# Patient Record
Sex: Male | Born: 1970 | Race: Asian | Hispanic: No | Marital: Married | Smoking: Current every day smoker
Health system: Southern US, Community
[De-identification: ages and names within clinical notes are randomized; demographics above are authoritative.]

## PROBLEM LIST (undated history)

## (undated) DIAGNOSIS — K219 Gastro-esophageal reflux disease without esophagitis: Secondary | ICD-10-CM

## (undated) DIAGNOSIS — R1310 Dysphagia, unspecified: Secondary | ICD-10-CM

## (undated) DIAGNOSIS — R634 Abnormal weight loss: Secondary | ICD-10-CM

## (undated) DIAGNOSIS — J387 Other diseases of larynx: Secondary | ICD-10-CM

## (undated) DIAGNOSIS — C801 Malignant (primary) neoplasm, unspecified: Secondary | ICD-10-CM

## (undated) HISTORY — PX: NECK LESION BIOPSY: SHX2078

## (undated) HISTORY — PX: WISDOM TOOTH EXTRACTION: SHX21

---

## 2015-09-01 HISTORY — PX: OTHER SURGICAL HISTORY: SHX169

## 2015-10-10 ENCOUNTER — Emergency Department
Admission: EM | Admit: 2015-10-10 | Discharge: 2015-10-10 | Disposition: A | Discharge status: Home / Self Care | Payer: BLUE CROSS/BLUE SHIELD | Attending: Emergency Medicine | Admitting: Emergency Medicine

## 2015-10-10 ENCOUNTER — Emergency Department: Payer: BLUE CROSS/BLUE SHIELD

## 2015-10-10 ENCOUNTER — Encounter: Payer: Self-pay | Admitting: Emergency Medicine

## 2015-10-10 DIAGNOSIS — R220 Localized swelling, mass and lump, head: Secondary | ICD-10-CM

## 2015-10-10 DIAGNOSIS — F17200 Nicotine dependence, unspecified, uncomplicated: Secondary | ICD-10-CM | POA: Diagnosis not present

## 2015-10-10 DIAGNOSIS — R221 Localized swelling, mass and lump, neck: Secondary | ICD-10-CM | POA: Insufficient documentation

## 2015-10-10 LAB — CBC WITH DIFFERENTIAL/PLATELET
BAND NEUTROPHILS: 0 %
BASOS ABS: 0.1 10*3/uL (ref 0–0.1)
BLASTS: 0 %
Basophils Relative: 1 %
EOS ABS: 3.3 10*3/uL — AB (ref 0–0.7)
EOS PCT: 27 %
HCT: 39.3 % — ABNORMAL LOW (ref 40.0–52.0)
Hemoglobin: 13.2 g/dL (ref 13.0–18.0)
LYMPHS ABS: 4.1 10*3/uL — AB (ref 1.0–3.6)
Lymphocytes Relative: 33 %
MCH: 29.1 pg (ref 26.0–34.0)
MCHC: 33.5 g/dL (ref 32.0–36.0)
MCV: 86.9 fL (ref 80.0–100.0)
METAMYELOCYTES PCT: 0 %
MONOS PCT: 3 %
MYELOCYTES: 0 %
Monocytes Absolute: 0.4 10*3/uL (ref 0.2–1.0)
NEUTROS ABS: 4.5 10*3/uL (ref 1.4–6.5)
Neutrophils Relative %: 36 %
Other: 0 %
PLATELETS: 313 10*3/uL (ref 150–440)
Promyelocytes Absolute: 0 %
RBC: 4.53 MIL/uL (ref 4.40–5.90)
RDW: 11.9 % (ref 11.5–14.5)
WBC: 12.4 10*3/uL — AB (ref 3.8–10.6)
nRBC: 0 /100 WBC

## 2015-10-10 LAB — TSH: TSH: 2.042 u[IU]/mL (ref 0.350–4.500)

## 2015-10-10 LAB — BASIC METABOLIC PANEL
ANION GAP: 9 (ref 5–15)
BUN: 8 mg/dL (ref 6–20)
CALCIUM: 9.3 mg/dL (ref 8.9–10.3)
CO2: 25 mmol/L (ref 22–32)
Chloride: 104 mmol/L (ref 101–111)
Creatinine, Ser: 0.83 mg/dL (ref 0.61–1.24)
Glucose, Bld: 99 mg/dL (ref 65–99)
POTASSIUM: 4.3 mmol/L (ref 3.5–5.1)
Sodium: 138 mmol/L (ref 135–145)

## 2015-10-10 MED ORDER — IOHEXOL 300 MG/ML  SOLN
75.0000 mL | Freq: Once | INTRAMUSCULAR | Status: AC | PRN
  Administered 2015-10-10: 75 mL via INTRAVENOUS
  Filled 2015-10-10: qty 75

## 2015-10-10 NOTE — ED Notes (Signed)
Pt states has had sore throat for one month. States he was given amoxicillin at walk-in clinic. Pt reports has reduced dosage to one time a day instead of twice because it gives him diarrhea. Denies cold symptoms.

## 2015-10-10 NOTE — ED Provider Notes (Signed)
[facility] Emergency Department Provider Note  ____________________________________________  Time seen: Approximately 2:34 PM  I have reviewed the triage vital signs and the nursing notes.   HISTORY  Chief Complaint Sore Throat    HPI Bobby Randolph is a 45 y.o. male patient say his sore throat for 1 month. Patient state he went to a walk-in clinic 1 week ago and was prescribed amoxicillin to take twice a day. Patient states he decreased the dosage to one Sunday due to experiencing diarrhea taken the medication twice a day. Patient states continue having swelling in his stroke. Patient also complaining of pain to the right ear. No pelvis measures taken for this complaint. . History reviewed. No pertinent past medical history.  There are no active problems to display for this patient.   History reviewed. No pertinent past surgical history.  No current outpatient prescriptions on file.  Allergies Review of patient's allergies indicates no known allergies.  History reviewed. No pertinent family history.  Social History Social History  Substance Use Topics  . Smoking status: Current Every Day Smoker  . Smokeless tobacco: None  . Alcohol Use: No    Review of Systems Constitutional: No fever/chills Eyes: No visual changes. ENT: No sore throat. Cardiovascular: Denies chest pain. Respiratory: Denies shortness of breath. Gastrointestinal: No abdominal pain.  No nausea, no vomiting.  No diarrhea.  No constipation. Genitourinary: Negative for dysuria. Musculoskeletal: Negative for back pain. Skin: Negative for rash. Neurological: Negative for headaches, focal weakness or numbness. 10-point ROS otherwise negative.  ____________________________________________   PHYSICAL EXAM:  VITAL SIGNS: ED Triage Vitals  Enc Vitals Group     BP 10/10/15 1254 108/81 mmHg     Pulse Rate 10/10/15 1254 69     Resp 10/10/15 1254 18     Temp 10/10/15 1254  97.9 F (36.6 C)     Temp Source 10/10/15 1254 Oral     SpO2 10/10/15 1254 100 %     Weight 10/10/15 1254 126 lb (57.153 kg)     Height 10/10/15 1254 5\' 5"  (1.651 m)     Head Cir --      Peak Flow --      Pain Score 10/10/15 1255 4     Pain Loc --      Pain Edu? --      Excl. in [place]? --    Constitutional: Alert and oriented. Well appearing and in no acute distress. Eyes: Conjunctivae are normal. PERRL. EOMI. Head: Atraumatic. Nose: No congestion/rhinnorhea. Mouth/Throat: Mucous membranes are moist.  Oropharynx non-erythematous. Neck: No stridor.  No cervical spine tenderness to palpation. Hematological/Lymphatic/Immunilogical: No cervical lymphadenopathy. Bilateral  anterior cervical mass. Cardiovascular: Normal rate, regular rhythm. Grossly normal heart sounds.  Good peripheral circulation. Respiratory: Normal respiratory effort.  No retractions. Lungs CTAB. Gastrointestinal: Soft and nontender. No distention. No abdominal bruits. No CVA tenderness. Musculoskeletal: No lower extremity tenderness nor edema.  No joint effusions. Neurologic:  Normal speech and language. No gross focal neurologic deficits are appreciated. No gait instability. Skin:  Skin is warm, dry and intact. No rash noted. Psychiatric: Mood and affect are normal. Speech and behavior are normal.  ____________________________________________   LABS (all labs ordered are listed, but only abnormal results are displayed)  Labs Reviewed  CBC WITH DIFFERENTIAL/PLATELET - Abnormal; Notable for the following:    WBC 12.4 (*)    HCT 39.3 (*)    Lymphs Abs 4.1 (*)    Eosinophils Absolute 3.3 (*)    All  other components within normal limits  TSH  BASIC METABOLIC PANEL   ____________________________________________  EKG   ____________________________________________  RADIOLOGY  5.3 cm mass centered in right piriform sinus with invasion laterally through the thyrohyoid membrane with thyroid cartilage  distraction. Finding consistent with malignancy with concern for metastatic disease.  PROCEDURES  Procedure(s) performed: None  Critical Care performed: No  ____________________________________________   INITIAL IMPRESSION / ASSESSMENT AND PLAN / ED COURSE  Pertinent labs & imaging results that were available during my care of the patient were reviewed by me and considered in my medical decision making (see chart for details).  Nuchal Mass. discussed findings with on-call oncologist who will contact patient in the morning. ____________________________________________   FINAL CLINICAL IMPRESSION(S) / ED DIAGNOSES  Final diagnoses:  Head or neck swelling, mass, or lump      Sable Feil, PA-C 10/10/15 [place], PA-C 10/10/15 1857  Earleen Newport, MD 10/11/15 [phone]

## 2015-10-10 NOTE — ED Notes (Signed)
Pt to ed with c/o sore throat and right ear pain x 1 month.  Pt states he started amoxicillin about 1 week ago, but is unable to take two per day as prescribed due to diarrhea.  Pt is currently taking one tab of amoxicillin per day but states the pain remains in right ear.

## 2015-10-10 NOTE — Discharge Instructions (Signed)
Follow-up with oncologist tomorrow morning.

## 2015-10-11 ENCOUNTER — Encounter: Payer: Self-pay | Admitting: Oncology

## 2015-10-11 ENCOUNTER — Inpatient Hospital Stay: Payer: BLUE CROSS/BLUE SHIELD

## 2015-10-11 ENCOUNTER — Inpatient Hospital Stay: Payer: BLUE CROSS/BLUE SHIELD | Attending: Oncology | Admitting: Oncology

## 2015-10-11 VITALS — BP 126/86 | HR 63 | Temp 99.1°F | Resp 18 | Ht 65.0 in | Wt 133.6 lb

## 2015-10-11 DIAGNOSIS — R221 Localized swelling, mass and lump, neck: Secondary | ICD-10-CM | POA: Diagnosis not present

## 2015-10-11 DIAGNOSIS — R918 Other nonspecific abnormal finding of lung field: Secondary | ICD-10-CM | POA: Diagnosis not present

## 2015-10-11 DIAGNOSIS — R591 Generalized enlarged lymph nodes: Secondary | ICD-10-CM | POA: Insufficient documentation

## 2015-10-11 DIAGNOSIS — R634 Abnormal weight loss: Secondary | ICD-10-CM | POA: Diagnosis not present

## 2015-10-11 DIAGNOSIS — J029 Acute pharyngitis, unspecified: Secondary | ICD-10-CM | POA: Insufficient documentation

## 2015-10-11 DIAGNOSIS — F17210 Nicotine dependence, cigarettes, uncomplicated: Secondary | ICD-10-CM | POA: Insufficient documentation

## 2015-10-11 DIAGNOSIS — R1310 Dysphagia, unspecified: Secondary | ICD-10-CM | POA: Diagnosis not present

## 2015-10-11 LAB — PROTIME-INR
INR: 1.04
Prothrombin Time: 13.8 seconds (ref 11.4–15.0)

## 2015-10-11 LAB — APTT: aPTT: 36 seconds (ref 24–36)

## 2015-10-11 NOTE — Progress Notes (Signed)
2 weeks of lump on right side of neck, went to acute care got atb and developed diarrhea and then stopped the med.  Back to acute care and sent to ER and ct showed soft tissue mass and opacity in right upper lobe.  Pt having difficulty swallowing.

## 2015-10-12 ENCOUNTER — Other Ambulatory Visit: Payer: Self-pay | Admitting: Radiology

## 2015-10-12 NOTE — Progress Notes (Signed)
[place]  Telephone:([phone] [phone]  ID: Bobby Randolph OB: 1971/04/19  MR#: [id number]  [id number]  Patient Care Team: No Pcp Per Patient as PCP - General (General Practice)  CHIEF COMPLAINT:  Chief Complaint  Patient presents with  . abnormal ct scan    INTERVAL HISTORY: Patient is a 45 year old male who was in his usual state of health until he had some teeth pulled approximately one month ago. He noticed an enlarging lump on the side of his neck approximately 2 weeks ago. He was evaluated at urgent care and was given some antibiotics. He developed diarrhea from his treatments and subsequent stopped the antibiotic. The mass on his neck continued to enlarge and he subsequently went to the emergency room for further evaluation. CT scan was obtained revealing a 5 cm right neck mass. He has dysphagia. He also admits to some weight loss over the past month, but attributes this to difficulty eating and minimal PO intake. He denies any fevers or night sweats. He has no neurologic complaints. He denies any chest pain or shortness of breath. He denies any nausea, vomiting, constipation, or diarrhea. He has no urinary complaints. Patient otherwise feels well and offers no further specific complaints.  REVIEW OF SYSTEMS:   Review of Systems  Constitutional: Positive for weight loss. Negative for fever and malaise/fatigue.  HENT: Positive for sore throat. Negative for congestion, hearing loss and tinnitus.   Respiratory: Negative.  Negative for cough, hemoptysis and shortness of breath.   Cardiovascular: Negative.   Gastrointestinal: Negative.   Genitourinary: Negative.   Musculoskeletal: Negative.   Neurological: Negative.  Negative for weakness and headaches.  Endo/Heme/Allergies: Does not bruise/bleed easily.    As per HPI. Otherwise, a complete review of systems is negatve.  PAST MEDICAL HISTORY: History reviewed. No pertinent past medical  history.  PAST SURGICAL HISTORY: Past Surgical History  Procedure Laterality Date  . Tooth removal  09/01/2015    right upper wisdom tooth    FAMILY HISTORY Family History  Problem Relation Age of Onset  . Hypertension Mother   . Asthma Mother   . Hypothyroidism Sister        ADVANCED DIRECTIVES:    HEALTH MAINTENANCE: Social History  Substance Use Topics  . Smoking status: Current Every Day Smoker -- 0.25 packs/day for 20 years  . Smokeless tobacco: None  . Alcohol Use: 0.6 oz/week    1 Cans of beer per week     Comment: 1 can per week     Colonoscopy:  PAP:  Bone density:  Lipid panel:  No Known Allergies  No current outpatient prescriptions on file.   No current facility-administered medications for this visit.    OBJECTIVE: Filed Vitals:   10/11/15 1220  BP: 126/86  Pulse: 63  Temp: 99.1 F (37.3 C)  Resp: 18     Body mass index is 22.23 kg/(m^2).    ECOG FS:0 - Asymptomatic  General: Well-developed, well-nourished, no acute distress. Eyes: Pink conjunctiva, anicteric sclera. HEENT: Easily palpable right neck mass, clear oropharynx. Lungs: Clear to auscultation bilaterally. Heart: Regular rate and rhythm. No rubs, murmurs, or gallops. Abdomen: Soft, nontender, nondistended. No organomegaly noted, normoactive bowel sounds. Musculoskeletal: No edema, cyanosis, or clubbing. Neuro: Alert, answering all questions appropriately. Cranial nerves grossly intact. Skin: No rashes or petechiae noted. Psych: Normal affect.   LAB RESULTS:  Lab Results  Component Value Date   NA 138 10/10/2015   K 4.3 10/10/2015   CL 104  10/10/2015   CO2 25 10/10/2015   GLUCOSE 99 10/10/2015   BUN 8 10/10/2015   CREATININE 0.83 10/10/2015   CALCIUM 9.3 10/10/2015   GFRNONAA >60 10/10/2015   GFRAA >60 10/10/2015    Lab Results  Component Value Date   WBC 12.4* 10/10/2015   NEUTROABS 4.5 10/10/2015   HGB 13.2 10/10/2015   HCT 39.3* 10/10/2015   MCV 86.9  10/10/2015   PLT 313 10/10/2015     STUDIES: Ct Soft Tissue Neck W Contrast  10/10/2015  ADDENDUM REPORT: 10/10/2015 18:43 ADDENDUM: Partially visualized subcentimeter ground-glass opacities in the right upper lobe. Further evaluation with chest CT recommended to assess for infection/inflammation versus metastatic disease. Electronically Signed   By: Logan Bores M.D.   On: 10/10/2015 18:43  10/10/2015  CLINICAL DATA:  Right-sided sore throat and right-sided neck swelling for 1 month. Abnormal ultrasound. EXAM: CT NECK WITH CONTRAST TECHNIQUE: Multidetector CT imaging of the neck was performed using the standard protocol following the bolus administration of intravenous contrast. CONTRAST:  48mL OMNIPAQUE IOHEXOL 300 MG/ML  SOLN COMPARISON:  Thyroid ultrasound earlier today FINDINGS: Pharynx and larynx: There is a heterogeneously enhancing mass centered in the region of the right piriform sinus which measures approximately 5.3 x 3.5 x 4.7 cm. There areas of internal necrosis. The mass involves the right aryepiglottic fold, which is displaced leftward. There is mild narrowing of the supraglottic larynx. The airway remains patent. The mass extends posteriorly in the oropharynx and inferiorly into the right lateral aspect of the supraglottic larynx. The mass invades through the right thyrohyoid membrane extending laterally in the neck with involvement of the strap muscles. There are destructive changes involving the posterior aspect of the thyroid cartilage on the right. There is also asymmetric sclerosis of the right arytenoid cartilage and superior aspect of the right cricoid cartilage, indeterminate. Salivary glands: Parotid glands and left submandibular gland are unremarkable. The right submandibular gland is mildly displaced anteriorly by the above described mass. Thyroid: Unremarkable. The abut described mass is in close proximity to the superior pole of the right thyroid lobe but appears separate from  it. Lymph nodes: Mildly enlarged lymph nodes in the right neck measure up to 1.1 cm in short axis in level II, up to 8 mm in level III, up to 6 mm in level IV, and 8 mm in level IB. Vascular: Major vascular structures of the neck appear patent. Limited intracranial: The visualized portion of the brain is unremarkable. Visualized orbits: Unremarkable. Mastoids and visualized paranasal sinuses: Mild mucosal thickening in the ethmoid and maxillary sinuses. Clear mastoid air cells. Skeleton: Mild cervical spondylosis. No suspicious lytic or blastic osseous lesion identified. Upper chest: Minimal pleural thickening in the lung apices. Subcentimeter ground-glass opacities partially visualized in the right upper lobe. IMPRESSION: 1. 5.3 cm mass centered in the region of the right piriform sinus with invasion laterally through the thyrohyoid membrane and with thyroid cartilage destruction as above, consistent with malignancy. 2. Mildly enlarged right level IB-IV lymph nodes, concerning for nodal metastatic disease. Electronically Signed: By: Logan Bores M.D. On: 10/10/2015 18:16   US Soft Tissue Head/neck  10/10/2015  CLINICAL DATA:  Neck swelling for 1 month. EXAM: THYROID ULTRASOUND TECHNIQUE: Ultrasound examination of the thyroid gland and adjacent soft tissues was performed. COMPARISON:  None. FINDINGS: Right thyroid lobe Measurements: 5.1 x 1.4 x 1.7 cm.  No nodules visualized. Left thyroid lobe Measurements: 3.3 x 1.0 x 1.3 cm.  No nodules visualized. Isthmus Thickness: 0.6 cm.  Two adjacent hypoechoic nodular structures associated with the isthmus. These measure less than one cm in size, individually. These could represent isthmic nodules but difficult to exclude lymph nodes along the anterior aspect of the thyroid gland. Lymphadenopathy There is an enlarged right neck lymph node measuring 2.0 x 0.7 x 0.6 cm with loss of the normal fatty hilum. There are additional prominent lymph nodes on the right side of the  neck. Few prominent left-sided lymph nodes, largest measuring 1.7 x 0.7 x 0.5 cm. There is a large hypoechoic heterogeneous lesion along the right neck, located above the thyroid and near the right side of the jaw. This abnormal soft tissue lesion has internal vascularity and roughly measures 6.0 x 4.2 x 4.0 cm. IMPRESSION: Large heterogeneous lesion in the right upper neck, near the submandibular region. There is evidence for bilateral neck lymphadenopathy. Findings are concerning for a neoplastic process and recommend further evaluation with a post contrast neck CT. Two small nodules along the thyroid isthmus. These could represent small thyroid nodules versus adjacent small lymph nodes. Otherwise, no suspicious thyroid nodules. These results will be called to the ordering clinician or representative by the Radiologist Assistant, and communication documented in the PACS or zVision Dashboard. Electronically Signed   By: Markus Daft M.D.   On: 10/10/2015 16:15    ASSESSMENT: Right neck mass, suspicious for malignancy.  PLAN:    1. Right neck mass: Suspicious for malignancy, possibly a primary head and neck given his smoking history. Although lymphoma is also a possibility. Will get ultrasound guided biopsy later this week to determine the diagnosis. Given the groundglass opacities seen in the upper portion of patient's lung, he will also require a dedicated chest CT or possibly PET scan in the near future for further evaluation and metastatic workup. Return to clinic one week after his biopsy to discuss the results and treatment planning if necessary.  Approximately 45 minutes was spent in discussion of which greater than 50% was consultation.  Patient expressed understanding and was in agreement with this plan. He also understands that He can call clinic at any time with any questions, concerns, or complaints.     Lloyd Huger, MD   10/12/2015 1:18 PM

## 2015-10-13 ENCOUNTER — Ambulatory Visit
Admission: RE | Admit: 2015-10-13 | Discharge: 2015-10-13 | Disposition: A | Discharge status: Home / Self Care | Payer: BLUE CROSS/BLUE SHIELD | Source: Ambulatory Visit | Attending: Oncology | Admitting: Oncology

## 2015-10-13 DIAGNOSIS — F17210 Nicotine dependence, cigarettes, uncomplicated: Secondary | ICD-10-CM | POA: Insufficient documentation

## 2015-10-13 DIAGNOSIS — R1310 Dysphagia, unspecified: Secondary | ICD-10-CM | POA: Diagnosis present

## 2015-10-13 DIAGNOSIS — J029 Acute pharyngitis, unspecified: Secondary | ICD-10-CM | POA: Insufficient documentation

## 2015-10-13 DIAGNOSIS — R221 Localized swelling, mass and lump, neck: Secondary | ICD-10-CM | POA: Insufficient documentation

## 2015-10-13 MED ORDER — FENTANYL CITRATE (PF) 100 MCG/2ML IJ SOLN
INTRAMUSCULAR | Status: AC | PRN
  Administered 2015-10-13: 50 ug via INTRAVENOUS

## 2015-10-13 MED ORDER — SODIUM CHLORIDE 0.9 % IV SOLN
Freq: Once | INTRAVENOUS | Status: AC
  Administered 2015-10-13: 14:00:00 via INTRAVENOUS

## 2015-10-13 MED ORDER — MIDAZOLAM HCL 2 MG/2ML IJ SOLN
INTRAMUSCULAR | Status: AC | PRN
  Administered 2015-10-13: 1 mg via INTRAVENOUS

## 2015-10-13 NOTE — H&P (Signed)
Chief Complaint: Here for neck bx  Referring Physician(s): Bobby Randolph  History of Present Illness: Bobby Randolph is a 45 y.o. male an enlarging palpable right neck mass and abnormal adenopathy by neck CT.  Mild sore throat and dysphagia. Recent weight loss.  No fevers.  Here for [place] bx of the neck mass today.  History reviewed. No pertinent past medical history.  Past Surgical History  Procedure Laterality Date  . Tooth removal  09/01/2015    right upper wisdom tooth    Allergies: Review of patient's allergies indicates no known allergies.  Medications: Prior to Admission medications   Not on File     Family History  Problem Relation Age of Onset  . Hypertension Mother   . Asthma Mother   . Hypothyroidism Sister     Social History   Social History  . Marital Status: Married    Spouse Name: N/A  . Number of Children: N/A  . Years of Education: N/A   Social History Main Topics  . Smoking status: Current Every Day Smoker -- 0.25 packs/day for 20 years  . Smokeless tobacco: None  . Alcohol Use: 0.6 oz/week    1 Cans of beer per week     Comment: 1 can per week  . Drug Use: No  . Sexual Activity: Not Asked   Other Topics Concern  . None   Social History Narrative    ECOG Status:  1 - Symptomatic but completely ambulatory  Review of Systems: A 12 point ROS discussed and pertinent positives are indicated in the HPI above.  All other systems are negative.  Review of Systems  Vital Signs: There were no vitals taken for this visit.  Physical Exam  Constitutional: He appears well-developed and well-nourished. No distress.  Neck: Normal range of motion.  Large firm palpable right neck mass   Cardiovascular: Normal rate and regular rhythm.   No murmur heard. Pulmonary/Chest: Effort normal and breath sounds normal. No respiratory distress. He has no wheezes.  Abdominal: Soft. Bowel sounds are normal. He exhibits no distension.    Lymphadenopathy:    He has cervical adenopathy.  Skin: Skin is warm and dry. No rash noted. He is not diaphoretic. No erythema.  Psychiatric: He has a normal mood and affect. His behavior is normal.    Mallampati Score:   1  Imaging: Ct Soft Tissue Neck W Contrast  10/10/2015  ADDENDUM REPORT: 10/10/2015 18:43 ADDENDUM: Partially visualized subcentimeter ground-glass opacities in the right upper lobe. Further evaluation with chest CT recommended to assess for infection/inflammation versus metastatic disease. Electronically Signed   By: Logan Bores M.D.   On: 10/10/2015 18:43  10/10/2015  CLINICAL DATA:  Right-sided sore throat and right-sided neck swelling for 1 month. Abnormal ultrasound. EXAM: CT NECK WITH CONTRAST TECHNIQUE: Multidetector CT imaging of the neck was performed using the standard protocol following the bolus administration of intravenous contrast. CONTRAST:  18mL OMNIPAQUE IOHEXOL 300 MG/ML  SOLN COMPARISON:  Thyroid ultrasound earlier today FINDINGS: Pharynx and larynx: There is a heterogeneously enhancing mass centered in the region of the right piriform sinus which measures approximately 5.3 x 3.5 x 4.7 cm. There areas of internal necrosis. The mass involves the right aryepiglottic fold, which is displaced leftward. There is mild narrowing of the supraglottic larynx. The airway remains patent. The mass extends posteriorly in the oropharynx and inferiorly into the right lateral aspect of the supraglottic larynx. The mass invades through the right thyrohyoid membrane extending  laterally in the neck with involvement of the strap muscles. There are destructive changes involving the posterior aspect of the thyroid cartilage on the right. There is also asymmetric sclerosis of the right arytenoid cartilage and superior aspect of the right cricoid cartilage, indeterminate. Salivary glands: Parotid glands and left submandibular gland are unremarkable. The right submandibular gland is  mildly displaced anteriorly by the above described mass. Thyroid: Unremarkable. The abut described mass is in close proximity to the superior pole of the right thyroid lobe but appears separate from it. Lymph nodes: Mildly enlarged lymph nodes in the right neck measure up to 1.1 cm in short axis in level II, up to 8 mm in level III, up to 6 mm in level IV, and 8 mm in level IB. Vascular: Major vascular structures of the neck appear patent. Limited intracranial: The visualized portion of the brain is unremarkable. Visualized orbits: Unremarkable. Mastoids and visualized paranasal sinuses: Mild mucosal thickening in the ethmoid and maxillary sinuses. Clear mastoid air cells. Skeleton: Mild cervical spondylosis. No suspicious lytic or blastic osseous lesion identified. Upper chest: Minimal pleural thickening in the lung apices. Subcentimeter ground-glass opacities partially visualized in the right upper lobe. IMPRESSION: 1. 5.3 cm mass centered in the region of the right piriform sinus with invasion laterally through the thyrohyoid membrane and with thyroid cartilage destruction as above, consistent with malignancy. 2. Mildly enlarged right level IB-IV lymph nodes, concerning for nodal metastatic disease. Electronically Signed: By: Logan Bores M.D. On: 10/10/2015 18:16   US Soft Tissue Head/neck  10/10/2015  CLINICAL DATA:  Neck swelling for 1 month. EXAM: THYROID ULTRASOUND TECHNIQUE: Ultrasound examination of the thyroid gland and adjacent soft tissues was performed. COMPARISON:  None. FINDINGS: Right thyroid lobe Measurements: 5.1 x 1.4 x 1.7 cm.  No nodules visualized. Left thyroid lobe Measurements: 3.3 x 1.0 x 1.3 cm.  No nodules visualized. Isthmus Thickness: 0.6 cm. Two adjacent hypoechoic nodular structures associated with the isthmus. These measure less than one cm in size, individually. These could represent isthmic nodules but difficult to exclude lymph nodes along the anterior aspect of the thyroid  gland. Lymphadenopathy There is an enlarged right neck lymph node measuring 2.0 x 0.7 x 0.6 cm with loss of the normal fatty hilum. There are additional prominent lymph nodes on the right side of the neck. Few prominent left-sided lymph nodes, largest measuring 1.7 x 0.7 x 0.5 cm. There is a large hypoechoic heterogeneous lesion along the right neck, located above the thyroid and near the right side of the jaw. This abnormal soft tissue lesion has internal vascularity and roughly measures 6.0 x 4.2 x 4.0 cm. IMPRESSION: Large heterogeneous lesion in the right upper neck, near the submandibular region. There is evidence for bilateral neck lymphadenopathy. Findings are concerning for a neoplastic process and recommend further evaluation with a post contrast neck CT. Two small nodules along the thyroid isthmus. These could represent small thyroid nodules versus adjacent small lymph nodes. Otherwise, no suspicious thyroid nodules. These results will be called to the ordering clinician or representative by the Radiologist Assistant, and communication documented in the PACS or zVision Dashboard. Electronically Signed   By: Markus Daft M.D.   On: 10/10/2015 16:15    Labs:  CBC:  Recent Labs  10/10/15 1447  WBC 12.4*  HGB 13.2  HCT 39.3*  PLT 313    COAGS:  Recent Labs  10/11/15 1307  INR 1.04  APTT 36    BMP:  Recent Labs  10/10/15 1447  NA 138  K 4.3  CL 104  CO2 25  GLUCOSE 99  BUN 8  CALCIUM 9.3  CREATININE 0.83  GFRNONAA >60  GFRAA >60    LIVER FUNCTION TESTS: No results for input(s): BILITOT, AST, ALT, ALKPHOS, PROT, ALBUMIN in the last 8760 hours.  TUMOR MARKERS: No results for input(s): AFPTM, CEA, CA199, CHROMGRNA in the last 8760 hours.  Assessment and Plan:  Palpable large right neck mass.  Plan for [place] core bx today.  Consent obtained including risks of bleeding and airway compromise.  Thank you for this interesting consult.  I greatly enjoyed meeting Bobby  Randolph and look forward to participating in their care.  A copy of this report was sent to the requesting provider on this date.  Electronically Signed: Greggory Keen 10/13/2015, 2:22 PM   I spent a total of  15 Minutes   in face to face in clinical consultation, greater than 50% of which was counseling/coordinating care for palpable right neck massconcerning for malignancy.

## 2015-10-13 NOTE — Procedures (Signed)
S/p [place] bx of the right neck mass No comp Stable Path pending Full report in PACS

## 2015-10-13 NOTE — Discharge Instructions (Signed)
Bone Marrow Aspiration and Bone Marrow Biopsy, Care After °Refer to this sheet in the next few weeks. These instructions provide you with information about caring for yourself after your procedure. Your health care provider may also give you more specific instructions. Your treatment has been planned according to current medical practices, but problems sometimes occur. Call your health care provider if you have any problems or questions after your procedure. °WHAT TO EXPECT AFTER THE PROCEDURE °After your procedure, it is common to have: °· Soreness or tenderness around the puncture site. °· Bruising. °HOME CARE INSTRUCTIONS °· Take medicines only as directed by your health care provider. °· Follow your health care provider's instructions about: °¨ Puncture site care. °¨ Bandage (dressing) changes and removal. °· Bathe and shower as directed by your health care provider. °· Check your puncture site every day for signs of infection. Watch for: °¨ Redness, swelling, or pain. °¨ Fluid, blood, or pus. °· Return to your normal activities as directed by your health care provider. °· Keep all follow-up visits as directed by your health care provider. This is important. °SEEK MEDICAL CARE IF: °· You have a fever. °· You have uncontrollable bleeding. °· You have redness, swelling, or pain at the site of your puncture. °· You have fluid, blood, or pus coming from your puncture site. °  °This information is not intended to replace advice given to you by your health care provider. Make sure you discuss any questions you have with your health care provider. °  °Document Released: 03/23/2005 Document Revised: 01/18/2015 Document Reviewed: 08/25/2014 °Elsevier Interactive Patient Education ©2016 [employer] ° °

## 2015-10-14 ENCOUNTER — Other Ambulatory Visit: Payer: BLUE CROSS/BLUE SHIELD

## 2015-10-14 ENCOUNTER — Other Ambulatory Visit: Payer: Self-pay | Admitting: Otolaryngology

## 2015-10-14 ENCOUNTER — Encounter: Payer: Self-pay | Admitting: *Deleted

## 2015-10-14 DIAGNOSIS — J387 Other diseases of larynx: Secondary | ICD-10-CM

## 2015-10-14 NOTE — Patient Instructions (Signed)
  Your procedure is scheduled on: 10-17-15 Report to [facility] To find out your arrival time please call [phone] between 1PM - 3PM on 10-14-15  Remember: Instructions that are not followed completely may result in serious medical risk, up to and including death, or upon the discretion of your surgeon and anesthesiologist your surgery may need to be rescheduled.    _X___ 1. Do not eat food or drink liquids after midnight. No gum chewing or hard candies.     _X___ 2. No Alcohol for 24 hours before or after surgery.   ____ 3. Bring all medications with you on the day of surgery if instructed.    ____ 4. Notify your doctor if there is any change in your medical condition     (cold, fever, infections).     Do not wear jewelry, make-up, hairpins, clips or nail polish.  Do not wear lotions, powders, or perfumes. You may wear deodorant.  Do not shave 48 hours prior to surgery. Men may shave face and neck.  Do not bring valuables to the hospital.    [facility] is not responsible for any belongings or valuables.               Contacts, dentures or bridgework may not be worn into surgery.  Leave your suitcase in the car. After surgery it may be brought to your room.  For patients admitted to the hospital, discharge time is determined by your treatment team.   Patients discharged the day of surgery will not be allowed to drive home.   Please read over the following fact sheets that you were given:      ____ Take these medicines the morning of surgery with A SIP OF WATER:    1.Minong   2.   3.   4.  5.  6.  ____ Fleet Enema (as directed)   ____ Use CHG Soap as directed  ____ Use inhalers on the day of surgery  ____ Stop metformin 2 days prior to surgery    ____ Take 1/2 of usual insulin dose the night before surgery and none on the morning of surgery.   ____ Stop Coumadin/Plavix/aspirin-N/A  ____ Stop  Anti-inflammatories-NO NSAIDS OR ASA PRODUCTS-TYLENOL OK TO TAKE   ____ Stop supplements until after surgery.    ____ Bring C-Pap to the hospital.

## 2015-10-17 ENCOUNTER — Encounter: Payer: Self-pay | Admitting: *Deleted

## 2015-10-17 ENCOUNTER — Inpatient Hospital Stay
Admission: RE | Admit: 2015-10-17 | Discharge: 2015-10-24 | DRG: 012 | Disposition: A | Discharge status: Home / Self Care | Payer: BLUE CROSS/BLUE SHIELD | Source: Ambulatory Visit | Attending: Otolaryngology | Admitting: Otolaryngology

## 2015-10-17 ENCOUNTER — Inpatient Hospital Stay: Payer: BLUE CROSS/BLUE SHIELD | Admitting: Registered Nurse

## 2015-10-17 ENCOUNTER — Encounter
Admission: RE | Disposition: A | Discharge status: Home / Self Care | Payer: Self-pay | Source: Ambulatory Visit | Attending: Otolaryngology

## 2015-10-17 DIAGNOSIS — R221 Localized swelling, mass and lump, neck: Secondary | ICD-10-CM | POA: Diagnosis present

## 2015-10-17 DIAGNOSIS — Z9889 Other specified postprocedural states: Secondary | ICD-10-CM | POA: Diagnosis not present

## 2015-10-17 DIAGNOSIS — J9811 Atelectasis: Secondary | ICD-10-CM | POA: Diagnosis present

## 2015-10-17 DIAGNOSIS — R1310 Dysphagia, unspecified: Secondary | ICD-10-CM | POA: Diagnosis present

## 2015-10-17 DIAGNOSIS — J387 Other diseases of larynx: Secondary | ICD-10-CM

## 2015-10-17 DIAGNOSIS — F17200 Nicotine dependence, unspecified, uncomplicated: Secondary | ICD-10-CM | POA: Diagnosis present

## 2015-10-17 DIAGNOSIS — D72829 Elevated white blood cell count, unspecified: Secondary | ICD-10-CM | POA: Diagnosis present

## 2015-10-17 DIAGNOSIS — C329 Malignant neoplasm of larynx, unspecified: Secondary | ICD-10-CM | POA: Diagnosis present

## 2015-10-17 DIAGNOSIS — Z930 Tracheostomy status: Secondary | ICD-10-CM

## 2015-10-17 DIAGNOSIS — C328 Malignant neoplasm of overlapping sites of larynx: Secondary | ICD-10-CM

## 2015-10-17 HISTORY — DX: Other diseases of larynx: J38.7

## 2015-10-17 HISTORY — DX: Gastro-esophageal reflux disease without esophagitis: K21.9

## 2015-10-17 HISTORY — DX: Dysphagia, unspecified: R13.10

## 2015-10-17 HISTORY — PX: TRACHEOSTOMY TUBE PLACEMENT: SHX814

## 2015-10-17 HISTORY — PX: LARYNGOSCOPY: SHX5203

## 2015-10-17 HISTORY — DX: Abnormal weight loss: R63.4

## 2015-10-17 HISTORY — DX: Malignant (primary) neoplasm, unspecified: C80.1

## 2015-10-17 LAB — CBC
HEMATOCRIT: 36.5 % — AB (ref 40.0–52.0)
HEMOGLOBIN: 12.2 g/dL — AB (ref 13.0–18.0)
MCH: 29 pg (ref 26.0–34.0)
MCHC: 33.6 g/dL (ref 32.0–36.0)
MCV: 86.4 fL (ref 80.0–100.0)
Platelets: 337 10*3/uL (ref 150–440)
RBC: 4.22 MIL/uL — ABNORMAL LOW (ref 4.40–5.90)
RDW: 12 % (ref 11.5–14.5)
WBC: 14.4 10*3/uL — ABNORMAL HIGH (ref 3.8–10.6)

## 2015-10-17 LAB — CREATININE, SERUM
Creatinine, Ser: 0.9 mg/dL (ref 0.61–1.24)
GFR calc Af Amer: >60 mL/min (ref >60)
GFR calc non Af Amer: >60 mL/min (ref >60)

## 2015-10-17 LAB — SURGICAL PATHOLOGY

## 2015-10-17 LAB — COMP PANEL: LEUKEMIA/LYMPHOMA

## 2015-10-17 SURGERY — CREATION, TRACHEOSTOMY
Anesthesia: Monitor Anesthesia Care

## 2015-10-17 MED ORDER — SUGAMMADEX SODIUM 200 MG/2ML IV SOLN
INTRAVENOUS | Status: DC | PRN
  Administered 2015-10-17: 150 mg via INTRAVENOUS

## 2015-10-17 MED ORDER — PROPOFOL 10 MG/ML IV BOLUS
INTRAVENOUS | Status: DC | PRN
  Administered 2015-10-17: 100 mg via INTRAVENOUS

## 2015-10-17 MED ORDER — MIDAZOLAM HCL 2 MG/2ML IJ SOLN
INTRAMUSCULAR | Status: DC | PRN
  Administered 2015-10-17: 2 mg via INTRAVENOUS

## 2015-10-17 MED ORDER — DEXMEDETOMIDINE HCL IN NACL 200 MCG/50ML IV SOLN
INTRAVENOUS | Status: DC | PRN
  Administered 2015-10-17: .7 ug/kg/h via INTRAVENOUS

## 2015-10-17 MED ORDER — LIDOCAINE-EPINEPHRINE 1 %-1:100000 IJ SOLN
INTRAMUSCULAR | Status: AC
  Filled 2015-10-17: qty 1

## 2015-10-17 MED ORDER — FENTANYL CITRATE (PF) 100 MCG/2ML IJ SOLN
25.0000 ug | INTRAMUSCULAR | Status: DC | PRN
  Administered 2015-10-17 (×4): 25 ug via INTRAVENOUS

## 2015-10-17 MED ORDER — DEXMEDETOMIDINE HCL IN NACL 200 MCG/50ML IV SOLN
INTRAVENOUS | Status: DC | PRN
  Administered 2015-10-17 (×2): 20 ug via INTRAVENOUS

## 2015-10-17 MED ORDER — DEXTROSE-NACL 5-0.45 % IV SOLN
INTRAVENOUS | Status: DC
  Administered 2015-10-17 – 2015-10-18 (×2): via INTRAVENOUS

## 2015-10-17 MED ORDER — GLYCOPYRROLATE 0.2 MG/ML IJ SOLN
INTRAMUSCULAR | Status: DC | PRN
  Administered 2015-10-17: 0.2 mg via INTRAVENOUS

## 2015-10-17 MED ORDER — ACETAMINOPHEN 650 MG RE SUPP: 650.0000 mg | RECTAL | Status: DC | PRN

## 2015-10-17 MED ORDER — ONDANSETRON HCL 4 MG/2ML IJ SOLN
INTRAMUSCULAR | Status: DC | PRN
  Administered 2015-10-17: 4 mg via INTRAVENOUS

## 2015-10-17 MED ORDER — FAMOTIDINE 20 MG PO TABS
ORAL_TABLET | ORAL | Status: AC
  Administered 2015-10-17: 20 mg
  Filled 2015-10-17: qty 1

## 2015-10-17 MED ORDER — LACTATED RINGERS IV SOLN
INTRAVENOUS | Status: DC
  Administered 2015-10-17: 09:00:00 via INTRAVENOUS

## 2015-10-17 MED ORDER — ONDANSETRON HCL 4 MG/2ML IJ SOLN: 4.0000 mg | INTRAMUSCULAR | Status: DC | PRN

## 2015-10-17 MED ORDER — MORPHINE SULFATE (PF) 2 MG/ML IV SOLN
2.0000 mg | INTRAVENOUS | Status: DC | PRN
  Administered 2015-10-17: 4 mg via INTRAVENOUS
  Administered 2015-10-17: 2 mg via INTRAVENOUS
  Administered 2015-10-18: 4 mg via INTRAVENOUS
  Administered 2015-10-18: 2 mg via INTRAVENOUS
  Filled 2015-10-17 (×2): qty 2
  Filled 2015-10-17 (×2): qty 1

## 2015-10-17 MED ORDER — LIDOCAINE-EPINEPHRINE 1 %-1:100000 IJ SOLN
INTRAMUSCULAR | Status: DC | PRN
  Administered 2015-10-17: 1 mL
  Administered 2015-10-17: 8 mL

## 2015-10-17 MED ORDER — FENTANYL CITRATE (PF) 100 MCG/2ML IJ SOLN
INTRAMUSCULAR | Status: AC
  Administered 2015-10-17: 25 ug via INTRAVENOUS
  Filled 2015-10-17: qty 2

## 2015-10-17 MED ORDER — ONDANSETRON HCL 4 MG/2ML IJ SOLN: 4.0000 mg | Freq: Once | INTRAMUSCULAR | Status: DC | PRN

## 2015-10-17 MED ORDER — ACETAMINOPHEN 160 MG/5ML PO SOLN
650.0000 mg | ORAL | Status: DC | PRN
  Filled 2015-10-17: qty 20.3

## 2015-10-17 MED ORDER — FENTANYL CITRATE (PF) 100 MCG/2ML IJ SOLN
INTRAMUSCULAR | Status: DC | PRN
  Administered 2015-10-17: 25 ug via INTRAVENOUS

## 2015-10-17 MED ORDER — DEXAMETHASONE SODIUM PHOSPHATE 10 MG/ML IJ SOLN
INTRAMUSCULAR | Status: DC | PRN
  Administered 2015-10-17: 5 mg via INTRAVENOUS

## 2015-10-17 MED ORDER — LIDOCAINE HCL (PF) 2 % IJ SOLN
INTRAMUSCULAR | Status: DC | PRN
  Administered 2015-10-17: 2.5 mL via INTRADERMAL

## 2015-10-17 MED ORDER — ONDANSETRON HCL 4 MG PO TABS: 4.0000 mg | ORAL_TABLET | ORAL | Status: DC | PRN

## 2015-10-17 MED ORDER — EPHEDRINE SULFATE 50 MG/ML IJ SOLN
INTRAMUSCULAR | Status: DC | PRN
  Administered 2015-10-17: 10 mg via INTRAVENOUS

## 2015-10-17 SURGICAL SUPPLY — 29 items
BLADE SURG 15 STRL LF DISP TIS (BLADE) ×2 IMPLANT
BLADE SURG 15 STRL SS (BLADE) ×2
BLADE SURG SZ11 CARB STEEL (BLADE) IMPLANT
CANISTER SUCT 1200ML W/VALVE (MISCELLANEOUS) ×4 IMPLANT
CORD BIP STRL DISP 12FT (MISCELLANEOUS) ×4 IMPLANT
ELECT REM PT RETURN 9FT ADLT (ELECTROSURGICAL) ×4
ELECTRODE REM PT RTRN 9FT ADLT (ELECTROSURGICAL) ×2 IMPLANT
FORCEPS JEWEL BIP 4-3/4 STR (INSTRUMENTS) ×4 IMPLANT
GAUZE IODOFORM PACK 1/2 7832 (GAUZE/BANDAGES/DRESSINGS) ×4 IMPLANT
GLOVE BIO SURGEON STRL SZ7.5 (GLOVE) ×4 IMPLANT
GOWN STRL REUS W/ TWL LRG LVL3 (GOWN DISPOSABLE) ×4 IMPLANT
GOWN STRL REUS W/TWL LRG LVL3 (GOWN DISPOSABLE) ×4
HARMONIC SCALPEL FOCUS (MISCELLANEOUS) ×4 IMPLANT
HEMOSTAT SURGICEL 2X3 (HEMOSTASIS) ×4 IMPLANT
KIT RM TURNOVER STRD PROC AR (KITS) ×4 IMPLANT
LABEL OR SOLS (LABEL) IMPLANT
NS IRRIG 500ML POUR BTL (IV SOLUTION) ×4 IMPLANT
PACK HEAD/NECK (MISCELLANEOUS) ×4 IMPLANT
SPONGE DRAIN TRACH 4X4 STRL 2S (GAUZE/BANDAGES/DRESSINGS) ×4 IMPLANT
SPONGE KITTNER 5P (MISCELLANEOUS) ×4 IMPLANT
SUCTION FRAZIER HANDLE 10FR (MISCELLANEOUS) ×2
SUCTION TUBE FRAZIER 10FR DISP (MISCELLANEOUS) ×2 IMPLANT
SUT SILK 2 0 (SUTURE) ×2
SUT SILK 2 0 SH (SUTURE) IMPLANT
SUT SILK 2-0 18XBRD TIE 12 (SUTURE) ×2 IMPLANT
SUT VIC AB 3-0 PS2 18 (SUTURE) ×4 IMPLANT
SYR 3ML LL SCALE MARK (SYRINGE) ×4 IMPLANT
TUBE TRACH SHILEY  6 DIST  CUF (TUBING) IMPLANT
TUBE TRACH SHILEY 8 DIST CUF (TUBING) ×4 IMPLANT

## 2015-10-17 NOTE — Progress Notes (Signed)
Admission  Pt received from PACU on bed. Suction setup and Ambu bag in room. Pt alert and oriented and complains of discomfort around new trach site. Will alert RT to pt's arrival and continue to monitor.

## 2015-10-17 NOTE — Progress Notes (Signed)
..   10/17/2015 4:20 PM  Bobby Randolph, Emanuell [id number]  Post-Op Day 0    Temp:  [97.7 F (36.5 C)-97.9 F (36.6 C)] 97.7 F (36.5 C) (01/30 1043) Pulse Rate:  [47-94] 52 (01/30 1431) Resp:  [15-21] 21 (01/30 1128) BP: (97-135)/(50-83) 115/73 mmHg (01/30 1431) SpO2:  [100 %] 100 % (01/30 1431) Weight:  [61.689 kg (136 lb)] 61.689 kg (136 lb) (01/30 0839),     Intake/Output Summary (Last 24 hours) at 10/17/15 1620 Last data filed at 10/17/15 1200  Gross per 24 hour  Intake   1100 ml  Output      0 ml  Net   1100 ml    Results for orders placed or performed during the hospital encounter of 10/17/15 (from the past 24 hour(s))  CBC     Status: Abnormal   Collection Time: 10/17/15  1:12 PM  Result Value Ref Range   WBC 14.4 (H) 3.8 - 10.6 K/uL   RBC 4.22 (L) 4.40 - 5.90 MIL/uL   Hemoglobin 12.2 (L) 13.0 - 18.0 g/dL   HCT 36.5 (L) 40.0 - 52.0 %   MCV 86.4 80.0 - 100.0 fL   MCH 29.0 26.0 - 34.0 pg   MCHC 33.6 32.0 - 36.0 g/dL   RDW 12.0 11.5 - 14.5 %   Platelets 337 150 - 440 K/uL  Creatinine, serum     Status: None   Collection Time: 10/17/15  1:12 PM  Result Value Ref Range   Creatinine, Ser 0.90 0.61 - 1.24 mg/dL   GFR calc non Af Amer >60 >60 mL/min   GFR calc Af Amer >60 >60 mL/min    SUBJECTIVE:  S/p awake tracheostomy.  Doing well.  Did cough some after procedure but has calmed down.  Patient communicating through nodding.  FMLA paperwork delivered to room.  OBJECTIVE:  GEN- NAD, alert in bed NECK- Stable large right sided mass, size 8 cuffed shiley in place and secure.  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 Will plan on deflating cuff 2)  Speech eval for passey muir valve and bedside swallowing after deflating 3)  PET on Wed. For staging purposes 4)  Present at Tumor Board on Thursday 5)  [facility] Appointment for evaluation for surgical resection  on Friday 6)  NPO except ice chips tonight and hopefully advance diet tomorrow after speech eval. 7)  SCD's for prophylaxis 8)  Trach care teaching for family from respiratory/nursing  Brooks 10/17/2015, 4:20 PM

## 2015-10-17 NOTE — H&P (Signed)
..  History and Physical paper copy reviewed and updated date of procedure and will be scanned into system.  

## 2015-10-17 NOTE — Op Note (Signed)
..10/17/2015 10:48 AM  Bobby Randolph [id number]  Pre-Op Dx: Squamous Cell Carcinoma of Larynx T4, Airway compromise, Dysphagia  Post-Op Dx:  same  Proc:  1)  Awake Tracheostomy  2)  Diagnostic laryngoscopy  Surg:  Bobby Randolph   Assist:  Bobby Randolph  Anes:  GOT  EBL:  <5  Comp:  none  Findings:  Size 8 cuffed shiley successfully placed under IV and local anesthesia.  Large exophytic mass involving the right pharyngeal wall/aryepiglottic fold/piriform sinus/right true and false vocal fold consistent with CT findings of large aggressive tumor.  Procedure:  The patient was identified in holding and the consent and H&P was updated and reviewed.  All questions answered.  After this, the patient was brought from holding it to the operating room and transferred to an operating table.  2L nasal cannula was begun and the patient was given light IV sedation to continue spontaneous breathing.  This was adjusted as needed throughout the case.  The lower neck was palpated with the findings as described above.  1% Xylocaine with 1:100,000 epinephrine, 8 cc's, was infiltrated into the surgical field for intraoperative hemostasis.  Several minutes were allowed for this to take effect. The patient was prepped in a sterile fashion with a surgical prep from the chin down to the upper chest.  Sterile draping was accomplished in the standard fashion except for leaving the patient's face visible to prevent tenting over the oxygen cannula.  A  5 cm horizontal incision was made sharply a finger's breadth above the sternal notch, and extended through skin and subcutaneous fat.  Using cautery, the superficial layer of the deep cervical fascia was lysed.  Additional dissection revealed the strap muscles.  The midline raphe was divided in two layers and the muscles retracted laterally.  The pretracheal plane was visualized.  This was entered bluntly.  The thyroid isthmus was isolated and divided with the  Harmonic scalpel.  The thyroid gland was retracted to either side.  The anterior face of the trachea was cleared.  In the  2nd and 3rd interspace, a transverse incision was made between cartilage rings into the tracheal lumen.   A previously tested  # 8 Shiley cuffed tracheostomy tube was brought into the field.  The tracheostomy tube was inserted into the tracheal lumen.  Hemostasis was observed. The cuff was inflated and observed to be intact and containing pressure. The inner cannula was placed and ventilation assumed per tracheostomy tube.  Good chest wall motion was observed, and CO2 was documented per anesthesia.  The trach tube was secured in the standard fashion with trach ties. A 2-0 silk suture was used to secure the trach tube to the skin on both sides.  Hemostasis was observed again.    At this point, the patient was rotated 90 degrees and a mouth guard was placed.  Using a Dedo laryngoscope, the patient's oral cavity was evaluated.  This revealed very poor dentition but no abnormal masses or lesions.  The patient's tonsils were evaluated and noted to be normal to visual and palpable exam.  Just below the level of the inferior tonsil pole on the patient's right side, a large exophytic mass was noted with fibrinous/necrotic debris covering it.  This mass extending down the pharyngeal wall and anteriorly involved the epiglottis/aryepiglottic fold/lateral wall/right piriform sinus and extended over to block view of the larynx completely.  The left arytenoid was visualized and noted to be edematous.  Previous biopsies had already been taken do no  further biopsies were taken.  In attempted to visualize the post-cricoid region, the tumor began to bleed some when displaced.  At this time, due to patient having PET for evaluation as well, decision was made to stop the procedure prior to beginning of more bleeding.  At this point the procedure was completed.  The patient was returned to anesthesia, awakened  as possible, and transferred back to the intensive care unit in stable condition.   [name]  10:48 AM 10/17/2015

## 2015-10-17 NOTE — Transfer of Care (Signed)
Immediate Anesthesia Transfer of Care Note  Patient: Bobby Randolph  Procedure(s) Performed: Procedure(s): TRACHEOSTOMY (N/A) DIAGNOSTIC LARYNGOSCOPY  Patient Location: PACU  Anesthesia Type:General  Level of Consciousness: sedated  Airway & Oxygen Therapy: Patient Spontanous Breathing and Patient connected to face mask oxygen  Post-op Assessment: Report given to RN and Post -op Vital signs reviewed and stable  Post vital signs: Reviewed and stable  Last Vitals:  Filed Vitals:   10/17/15 0839 10/17/15 1043  BP: 135/83 104/71  Pulse: 64 54  Temp: 36.6 C 36.5 C  Resp: 16 15    Complications: No apparent anesthesia complications

## 2015-10-17 NOTE — Anesthesia Preprocedure Evaluation (Addendum)
Anesthesia Evaluation  Patient identified by MRN, date of birth, ID band Patient awake    Reviewed: Allergy & Precautions, H&P , NPO status , Patient's Chart, lab work & pertinent test results, reviewed documented beta blocker date and time   Airway Mallampati: III  TM Distance: >3 FB Neck ROM: full    Dental no notable dental hx. (+) Poor Dentition   Pulmonary neg pulmonary ROS, Current Smoker,    Pulmonary exam normal breath sounds clear to auscultation       Cardiovascular Exercise Tolerance: Good hypertension, negative cardio ROS Normal cardiovascular exam Rhythm:regular Rate:Normal     Neuro/Psych negative neurological ROS  negative psych ROS   GI/Hepatic negative GI ROS, Neg liver ROS, GERD  ,  Endo/Other  negative endocrine ROSdiabetes  Renal/GU negative Renal ROS  negative genitourinary   Musculoskeletal negative musculoskeletal ROS (+)   Abdominal   Peds negative pediatric ROS (+)  Hematology negative hematology ROS (+)   Anesthesia Other Findings Past Medical History:   Dysphagia                                                    Weight loss, unintentional                                   GERD (gastroesophageal reflux disease)                         Comment:RELATED TO SPICY FOODS-NO MEDS   Laryngeal mass                                               Cancer (HCC)                                               Past Surgical History:   tooth removal                                    09/01/2015     Comment:right upper wisdom tooth   WISDOM TOOTH EXTRACTION                                       NECK LESION BIOPSY                                          BMI    Body Mass Index   20.68 kg/m 2     Reproductive/Obstetrics negative OB ROS                            Anesthesia Physical Anesthesia Plan  ASA: II  Anesthesia Plan: MAC   Post-op Pain Management:    Induction:  Airway Management Planned:   Additional Equipment:   Intra-op Plan:   Post-operative Plan:   Informed Consent: I have reviewed the patients History and Physical, chart, labs and discussed the procedure including the risks, benefits and alternatives for the proposed anesthesia with the patient or authorized representative who has indicated his/her understanding and acceptance.   Dental Advisory Given  Plan Discussed with: CRNA  Anesthesia Plan Comments: (Plan discussed with Dr. Pryor Ochoa and request for sedation and awake tracheostomy with evolution of plan contingent on cirumstances.JA)        Anesthesia Quick Evaluation

## 2015-10-18 ENCOUNTER — Encounter: Payer: Self-pay | Admitting: Otolaryngology

## 2015-10-18 LAB — CBC WITH DIFFERENTIAL/PLATELET
Basophils Absolute: 0.1 10*3/uL (ref 0–0.1)
Basophils Relative: 1 %
Eosinophils Absolute: 1.6 10*3/uL — ABNORMAL HIGH (ref 0–0.7)
Eosinophils Relative: 11 %
HEMATOCRIT: 40.4 % (ref 40.0–52.0)
HEMOGLOBIN: 13.4 g/dL (ref 13.0–18.0)
LYMPHS ABS: 1.9 10*3/uL (ref 1.0–3.6)
Lymphocytes Relative: 13 %
MCH: 29.1 pg (ref 26.0–34.0)
MCHC: 33.2 g/dL (ref 32.0–36.0)
MCV: 87.8 fL (ref 80.0–100.0)
MONOS PCT: 6 %
Monocytes Absolute: 0.9 10*3/uL (ref 0.2–1.0)
NEUTROS ABS: 10.4 10*3/uL — AB (ref 1.4–6.5)
NEUTROS PCT: 69 %
Platelets: 331 10*3/uL (ref 150–440)
RBC: 4.61 MIL/uL (ref 4.40–5.90)
RDW: 11.9 % (ref 11.5–14.5)
WBC: 14.9 10*3/uL — ABNORMAL HIGH (ref 3.8–10.6)

## 2015-10-18 LAB — BASIC METABOLIC PANEL
Anion gap: 5 (ref 5–15)
BUN: 6 mg/dL (ref 6–20)
CHLORIDE: 101 mmol/L (ref 101–111)
CO2: 29 mmol/L (ref 22–32)
CREATININE: 0.83 mg/dL (ref 0.61–1.24)
Calcium: 8.8 mg/dL — ABNORMAL LOW (ref 8.9–10.3)
GFR calc Af Amer: >60 mL/min (ref >60)
GFR calc non Af Amer: >60 mL/min (ref >60)
GLUCOSE: 116 mg/dL — AB (ref 65–99)
Potassium: 4.4 mmol/L (ref 3.5–5.1)
Sodium: 135 mmol/L (ref 135–145)

## 2015-10-18 MED ORDER — MORPHINE SULFATE 2 MG/ML IV SOLN
INTRAVENOUS | Status: DC
  Administered 2015-10-18: 14:00:00 via INTRAVENOUS
  Administered 2015-10-18: 5 mg via INTRAVENOUS
  Administered 2015-10-18: 3 mg via INTRAVENOUS
  Administered 2015-10-19 (×2): 2 mg via INTRAVENOUS
  Administered 2015-10-19: 7 mg via INTRAVENOUS
  Administered 2015-10-19: 5 mg via INTRAVENOUS
  Filled 2015-10-18: qty 25

## 2015-10-18 MED ORDER — LORAZEPAM 2 MG/ML IJ SOLN
1.0000 mg | Freq: Four times a day (QID) | INTRAMUSCULAR | Status: DC | PRN
  Administered 2015-10-18: 1 mg via INTRAVENOUS
  Filled 2015-10-18: qty 1

## 2015-10-18 MED ORDER — LEVOFLOXACIN IN D5W 500 MG/100ML IV SOLN
500.0000 mg | INTRAVENOUS | Status: DC
  Administered 2015-10-18 – 2015-10-20 (×3): 500 mg via INTRAVENOUS
  Filled 2015-10-18 (×4): qty 100

## 2015-10-18 MED ORDER — SODIUM CHLORIDE 0.45 % IV SOLN
INTRAVENOUS | Status: DC
Start: 2015-10-18 — End: 2015-10-19
  Administered 2015-10-18 – 2015-10-19 (×2): via INTRAVENOUS

## 2015-10-18 MED ORDER — DIPHENHYDRAMINE HCL 50 MG/ML IJ SOLN: 12.5000 mg | Freq: Four times a day (QID) | INTRAMUSCULAR | Status: DC | PRN

## 2015-10-18 MED ORDER — NALOXONE HCL 0.4 MG/ML IJ SOLN: 0.4000 mg | INTRAMUSCULAR | Status: DC | PRN

## 2015-10-18 MED ORDER — SODIUM CHLORIDE 0.9% FLUSH: 9.0000 mL | INTRAVENOUS | Status: DC | PRN

## 2015-10-18 MED ORDER — DIPHENHYDRAMINE HCL 12.5 MG/5ML PO ELIX
12.5000 mg | ORAL_SOLUTION | Freq: Four times a day (QID) | ORAL | Status: DC | PRN
  Filled 2015-10-18: qty 5

## 2015-10-18 NOTE — Progress Notes (Signed)
..   10/18/2015 8:09 AM  Bobby Randolph, Bobby Randolph [id number]  Post-Op Day 1    Temp:  [97.7 F (36.5 C)-100.2 F (37.9 C)] 100.2 F (37.9 C) (01/31 0428) Pulse Rate:  [47-94] 64 (01/31 0428) Resp:  [15-21] 18 (01/31 0428) BP: (97-135)/(50-83) 120/69 mmHg (01/31 0428) SpO2:  [100 %] 100 % (01/31 0428) FiO2 (%):  [28 %-40 %] 28 % (01/31 0409) Weight:  [61.689 kg (136 lb)] 61.689 kg (136 lb) (01/30 0839),     Intake/Output Summary (Last 24 hours) at 10/18/15 0809 Last data filed at 10/18/15 0519  Gross per 24 hour  Intake   3125 ml  Output      0 ml  Net   3125 ml    Results for orders placed or performed during the hospital encounter of 10/17/15 (from the past 24 hour(s))  CBC     Status: Abnormal   Collection Time: 10/17/15  1:12 PM  Result Value Ref Range   WBC 14.4 (H) 3.8 - 10.6 K/uL   RBC 4.22 (L) 4.40 - 5.90 MIL/uL   Hemoglobin 12.2 (L) 13.0 - 18.0 g/dL   HCT 36.5 (L) 40.0 - 52.0 %   MCV 86.4 80.0 - 100.0 fL   MCH 29.0 26.0 - 34.0 pg   MCHC 33.6 32.0 - 36.0 g/dL   RDW 12.0 11.5 - 14.5 %   Platelets 337 150 - 440 K/uL  Creatinine, serum     Status: None   Collection Time: 10/17/15  1:12 PM  Result Value Ref Range   Creatinine, Ser 0.90 0.61 - 1.24 mg/dL   GFR calc non Af Amer >60 >60 mL/min   GFR calc Af Amer >60 >60 mL/min    SUBJECTIVE:  No acute events.  Coughing last night.  Reports did not sleep well.  Pain controlled with IV medications.  Tolerating some ice chips.  OBJECTIVE:  GEN- NAD, supine in bed NECK- continued right sided neck mass, trach secure and in place.  Moderate bloody mucus.  Let cuff down and patient began to cough moderately and with increase in bloody mucus.  Cuff inflated and cough resolved  IMPRESSION:  S/p Trach for SCCA of larynx with impending respiratory compromise  PLAN:  1)  Attempt to deflate cuff later today 2)  Head of bed elevated to 30 degrees 3)  Speech once able to tolerate deflating of cuff 4)  PET tomorrow 5)  Ambulate  out of bed to chair  Bobby Randolph 10/18/2015, 8:09 AM

## 2015-10-18 NOTE — Care Management (Signed)
CM made three attempts to speak with patient.  He was being assessed by other members of the care team or was asleep

## 2015-10-18 NOTE — Anesthesia Postprocedure Evaluation (Signed)
Anesthesia Post Note  Patient: Bobby Randolph  Procedure(s) Performed: Procedure(s) (LRB): TRACHEOSTOMY (N/A) DIAGNOSTIC LARYNGOSCOPY  Patient location during evaluation: PACU Anesthesia Type: General Level of consciousness: awake and alert Pain management: pain level controlled Vital Signs Assessment: post-procedure vital signs reviewed and stable Respiratory status: spontaneous breathing, nonlabored ventilation, respiratory function stable and patient connected to nasal cannula oxygen Cardiovascular status: blood pressure returned to baseline and stable Postop Assessment: no signs of nausea or vomiting Anesthetic complications: no    Last Vitals:  Filed Vitals:   10/18/15 1314 10/18/15 1359  BP: 119/74   Pulse: 75   Temp: 38.1 C   Resp: 18 18    Last Pain:  Filed Vitals:   10/18/15 1559  PainSc: [name]

## 2015-10-18 NOTE — Progress Notes (Signed)
..   10/18/2015 5:56 PM  [name], Bobby [id number]  Post-Op Day 1    Temp:  [99.1 F (37.3 C)-100.5 F (38.1 C)] 100.5 F (38.1 C) (01/31 1314) Pulse Rate:  [59-75] 75 (01/31 1314) Resp:  [16-20] 18 (01/31 1359) BP: (108-120)/(67-74) 119/74 mmHg (01/31 1314) SpO2:  [100 %] 100 % (01/31 1500) FiO2 (%):  [28 %-40 %] 28 % (01/31 1500),     Intake/Output Summary (Last 24 hours) at 10/18/15 1756 Last data filed at 10/18/15 1602  Gross per 24 hour  Intake   2025 ml  Output    800 ml  Net   1225 ml    No results found for this or any previous visit (from the past 24 hour(s)).  SUBJECTIVE:  Elevated temperature with immobility, concern for atelectasis.  Had a 45 minute conversation with patient's brother on phone regarding patient's care.  OBJECTIVE:   GEN- NAD, supine in bed NECK- large right sided neck mass, improved bleeding from tracheostomy tube, dressing removed  IMPRESSION:  S/p trach for t4NXMx SCCA of larynx POD #1  PLAN:  1)  Begin levaquin as amoxicillin allergic 2) PET tomorrow 3)  Attempt to deflate again tomorrow after PET for trial of PO.  If continues to have issues consider NGT placement. 4)  CBC with diff and BMP  Bobby Randolph 10/18/2015, 5:56 PM

## 2015-10-18 NOTE — Progress Notes (Signed)
Speech Therapy Note: received order, reviewed chart notes. Noted MD's visit note this AM indicating pt was still attempting trials of cuff deflation but currently is experiencing moderate coughing w/ increased bloody mucus; when cuff was reinflated by the MD, the coughing resolved. As per MD note, rec. Speech f/u once pt is able to tolerate cuff deflation. Rec. Oral care frequently during the day d/t NPO status.

## 2015-10-19 ENCOUNTER — Ambulatory Visit: Admission: RE | Admit: 2015-10-19 | Payer: BLUE CROSS/BLUE SHIELD | Source: Ambulatory Visit

## 2015-10-19 ENCOUNTER — Inpatient Hospital Stay: Payer: BLUE CROSS/BLUE SHIELD

## 2015-10-19 LAB — BASIC METABOLIC PANEL
Anion gap: 8 (ref 5–15)
BUN: 10 mg/dL (ref 6–20)
CHLORIDE: 101 mmol/L (ref 101–111)
CO2: 26 mmol/L (ref 22–32)
CREATININE: 0.83 mg/dL (ref 0.61–1.24)
Calcium: 8.6 mg/dL — ABNORMAL LOW (ref 8.9–10.3)
GFR calc non Af Amer: >60 mL/min (ref >60)
Glucose, Bld: 94 mg/dL (ref 65–99)
POTASSIUM: 3.4 mmol/L — AB (ref 3.5–5.1)
SODIUM: 135 mmol/L (ref 135–145)

## 2015-10-19 LAB — GLUCOSE, CAPILLARY: GLUCOSE-CAPILLARY: 76 mg/dL (ref 65–99)

## 2015-10-19 MED ORDER — MORPHINE SULFATE (PF) 2 MG/ML IV SOLN
2.0000 mg | INTRAVENOUS | Status: DC | PRN
  Administered 2015-10-19 – 2015-10-20 (×4): 2 mg via INTRAVENOUS
  Filled 2015-10-19 (×4): qty 1

## 2015-10-19 MED ORDER — FLUDEOXYGLUCOSE F - 18 (FDG) INJECTION
12.2300 | Freq: Once | INTRAVENOUS | Status: AC | PRN
  Administered 2015-10-19: 12.23 via INTRAVENOUS

## 2015-10-19 MED ORDER — KCL IN DEXTROSE-NACL 20-5-0.45 MEQ/L-%-% IV SOLN
INTRAVENOUS | Status: DC
  Administered 2015-10-19 (×2): via INTRAVENOUS
  Administered 2015-10-20 – 2015-10-21 (×2): 1000 mL via INTRAVENOUS
  Filled 2015-10-19 (×8): qty 1000

## 2015-10-19 MED ORDER — MORPHINE BOLUS VIA INFUSION: 2.0000 mg | INTRAVENOUS | Status: DC | PRN

## 2015-10-19 NOTE — Progress Notes (Signed)
Per Dr. Pryor Ochoa pt had PCA discontinued and placed order for IV morphine  2mg  every 1 hr as needed. Order was not entered correctly and was changed from bolus to IV push, so nurse modified to 2mg  every 1hr as needed.

## 2015-10-19 NOTE — Progress Notes (Signed)
Initial Nutrition Assessment      INTERVENTION:  Meals and snacks: Await poc and SLP recommendations.   Coordination of care: noted per MD may need to place NG tube   NUTRITION DIAGNOSIS:   Inadequate oral intake related to acute illness as evidenced by NPO status.    GOAL:   Patient will meet greater than or equal to 90% of their needs    MONITOR:    (Energy intake, Digestive system)  REASON FOR ASSESSMENT:   Malnutrition Screening Tool    ASSESSMENT:      Pt s/p emergent trach for SCCA of larynx, PET scan today.  Pt out of room during rounds this am  Past Medical History  Diagnosis Date  . Dysphagia   . Weight loss, unintentional   . GERD (gastroesophageal reflux disease)     RELATED TO SPICY FOODS-NO MEDS  . Laryngeal mass   . Cancer [facility])     Current Nutrition: NPO  Food/Nutrition-Related History: Per wife intake has been decreased since December following wisdom teeth removal.  Typically eats good breakfast, then less for lunch and dinner. Mostly has been drinking liquids ( soups, juice)   Scheduled Medications:  . levofloxacin (LEVAQUIN) IV  500 mg Intravenous Q24H  . morphine   Intravenous 6 times per day    Continuous Medications:  . sodium chloride 110 mL/hr at 10/19/15 0227     Electrolyte/Renal Profile and Glucose Profile:   Recent Labs Lab 10/17/15 1312 10/18/15 1837 10/19/15 0427  NA  --  135 135  K  --  4.4 3.4*  CL  --  101 101  CO2  --  29 26  BUN  --  6 10  CREATININE 0.90 0.83 0.83  CALCIUM  --  8.8* 8.6*  GLUCOSE  --  116* 94    Gastrointestinal Profile: Last BM:1/29   Nutrition-Focused Physical Exam Findings: unable to perform, pt out of room   Weight Change: 4% wt loss in the last month  Wife reports 5 pound wt loss in the last month    Diet Order:  Diet NPO time specified Except for: Ice Chips  Skin:   reviewed   Height:   Ht Readings from Last 1 Encounters:  10/17/15 5\' 8"  (1.727 m)    Weight:    Wt Readings from Last 1 Encounters:  10/17/15 136 lb (61.689 kg)    Ideal Body Weight:     BMI:  Body mass index is 20.68 kg/(m^2).  Estimated Nutritional Needs:   Kcal:  BEE 1479 kcals (IF 1.0-1.2, AF 1.3) [id number] kcals/d  Protein:  (1.0-1.2 g/kg) 62-74 g/kg)   Fluid:  (25-31ml/kg) 1550-185ml/d  EDUCATION NEEDS:   No education needs identified at this time  HIGH Care Level  [name] B. Zenia Resides, [place], [place] (pager) Weekend/On-Call pager [phone])

## 2015-10-19 NOTE — Progress Notes (Signed)
.. 10/19/2015 10:40 AM  Posey Pronto, Faizan [id number]  Post-Op Day 2    Temp:  [99.4 F (37.4 C)-100.5 F (38.1 C)] 99.4 F (37.4 C) (02/01 0546) Pulse Rate:  [74-76] 74 (02/01 0546) Resp:  [14-19] 14 (02/01 0750) BP: (94-119)/(56-74) 94/56 mmHg (02/01 0546) SpO2:  [98 %-100 %] 100 % (02/01 0750) FiO2 (%):  [28 %-100 %] 28 % (02/01 0430),     Intake/Output Summary (Last 24 hours) at 10/19/15 1040 Last data filed at 10/19/15 0720  Gross per 24 hour  Intake   1117 ml  Output    700 ml  Net    417 ml    Results for orders placed or performed during the hospital encounter of 10/17/15 (from the past 24 hour(s))  CBC with Differential/Platelet     Status: Abnormal   Collection Time: 10/18/15  6:37 PM  Result Value Ref Range   WBC 14.9 (H) 3.8 - 10.6 K/uL   RBC 4.61 4.40 - 5.90 MIL/uL   Hemoglobin 13.4 13.0 - 18.0 g/dL   HCT 40.4 40.0 - 52.0 %   MCV 87.8 80.0 - 100.0 fL   MCH 29.1 26.0 - 34.0 pg   MCHC 33.2 32.0 - 36.0 g/dL   RDW 11.9 11.5 - 14.5 %   Platelets 331 150 - 440 K/uL   Neutrophils Relative % 69 %   Neutro Abs 10.4 (H) 1.4 - 6.5 K/uL   Lymphocytes Relative 13 %   Lymphs Abs 1.9 1.0 - 3.6 K/uL   Monocytes Relative 6 %   Monocytes Absolute 0.9 0.2 - 1.0 K/uL   Eosinophils Relative 11 %   Eosinophils Absolute 1.6 (H) 0 - 0.7 K/uL   Basophils Relative 1 %   Basophils Absolute 0.1 0 - 0.1 K/uL  Basic metabolic panel     Status: Abnormal   Collection Time: 10/18/15  6:37 PM  Result Value Ref Range   Sodium 135 135 - 145 mmol/L   Potassium 4.4 3.5 - 5.1 mmol/L   Chloride 101 101 - 111 mmol/L   CO2 29 22 - 32 mmol/L   Glucose, Bld 116 (H) 65 - 99 mg/dL   BUN 6 6 - 20 mg/dL   Creatinine, Ser 0.83 0.61 - 1.24 mg/dL   Calcium 8.8 (L) 8.9 - 10.3 mg/dL   GFR calc non Af Amer >60 >60 mL/min   GFR calc Af Amer >60 >60 mL/min   Anion gap 5 5 - 15  Basic metabolic panel     Status: Abnormal   Collection Time: 10/19/15  4:27 AM  Result Value Ref Range   Sodium 135  135 - 145 mmol/L   Potassium 3.4 (L) 3.5 - 5.1 mmol/L   Chloride 101 101 - 111 mmol/L   CO2 26 22 - 32 mmol/L   Glucose, Bld 94 65 - 99 mg/dL   BUN 10 6 - 20 mg/dL   Creatinine, Ser 0.83 0.61 - 1.24 mg/dL   Calcium 8.6 (L) 8.9 - 10.3 mg/dL   GFR calc non Af Amer >60 >60 mL/min   GFR calc Af Amer >60 >60 mL/min   Anion gap 8 5 - 15  Glucose, capillary     Status: None   Collection Time: 10/19/15  7:55 AM  Result Value Ref Range   Glucose-Capillary 76 65 - 99 mg/dL   Comment 1 Notify RN     SUBJECTIVE:  PET this a.m.Marland Kitchen  Fever yesterday with some leukocytosis.  Most likely atelectasis.  Improved this  a.m.  Pain improved.    OBJECTIVE:   GEN-  NAD, supine in bed NECK-  Continued large right sided neck mass, trach secure.  Improved secretions with no evidence of bleeding.  Trach cleaned and cuff deflated with minimal coughing and secretions.  IMPRESSION:  S/p trach POD#2 for T4 SCCA larynx  PLAN:  1)  Awaiting final PET results 2)  Speech today for evaluation of swallowing and PMV 3)  OOB to chair 4)  Replace K+ with IVF 5)  Hold on decision for NGT depending on how tolerates PO as preoperative, patient had minimal issues with swallowing 6)  D/C PCA  Sherah Lund 10/19/2015, 10:40 AM

## 2015-10-19 NOTE — Care Management (Signed)
Patient speaks english but family members in the room do not. He indicates that his wife does not speak english. Patient communicated with CM by writing.   Will need to meet with interpretor for discharge planning.  Discussed during progression the need to allow patient to start with hands on trach care as soon as medically possible.  SLP is to evaluate for PMV.  He is on 28% trach collar 02.  Anticipate the need to arrange for home health SN and 02. Results of PET pending

## 2015-10-19 NOTE — Evaluation (Signed)
Physical Therapy Evaluation Patient Details Name: Bobby Randolph MRN: [id number] DOB: 1971/06/19 Today's Date: 10/19/2015   History of Present Illness  Patient is a 46 year old male who was in his usual state of health until he had some teeth pulled approximately one month ago. He noticed an enlarging lump on the side of his neck approximately 2 weeks ago. He was evaluated at urgent care and was given some antibiotics. He developed diarrhea from his treatments and subsequent stopped the antibiotic. The mass on his neck continued to enlarge and he subsequently went to the emergency room for further evaluation. CT scan was obtained revealing a 5 cm right neck mass. He has dysphagia. He also admits to some weight loss over the past month, but attributes this to difficulty eating and minimal PO intake. Pt was admitted to [facility] for tracheostomy secondary to T4 SCCA larynx. Pt was previously fully independent with ADLs/IADLs. Ambulates full community distances without assistive device. Pt unable to vocalize answers so mostly performed yes/no questions with nodding as well as lip reading  Clinical Impression  Pt demonstrates independence with all mobility at this time. He is safe and steady with transfers and ambulation without assistive device. SaO2>95% on 6L/min O2 via trach collar during ambulation. No evidence for instability. Single leg stance >10 seconds and negative Rhomberg. Pt educated about the importance of continued ambulation with nursing staff to maintain strength and prevent deconditioning. Pt will be safe to discharge home when medically appropriate. No further PT needs identified. Will sign off. Please re-consult if status or needs change.    Follow Up Recommendations No PT follow up    Equipment Recommendations  None recommended by PT    Recommendations for Other Services       Precautions / Restrictions Precautions Precautions: Fall Precaution Comments: Trach collar on 5 L/min  O2 Restrictions Weight Bearing Restrictions: No      Mobility  Bed Mobility Overal bed mobility: Independent             General bed mobility comments: Good speed/sequencing  Transfers Overall transfer level: Independent Equipment used: None             General transfer comment: Good speed/sequencing. Stable in standing  Ambulation/Gait Ambulation/Gait assistance: Independent Ambulation Distance (Feet): 450 Feet Assistive device: None Gait Pattern/deviations: WFL(Within Functional Limits) Gait velocity: WFL Gait velocity interpretation: >2.62 ft/sec, indicative of independent community ambulator General Gait Details: Good safety and stability noted. Pt able to ambulate while pushing IV pole and O2. No evidence for instability noted. Unable to perform head turns with patient due to pain with motion at trach collar. SaO2 continuously monitored and remains >95% throghout entire ambulation distance  Stairs            Wheelchair Mobility    Modified Rankin (Stroke Patients Only)       Balance Overall balance assessment: Independent Sitting-balance support: No upper extremity supported Sitting balance-Leahy Scale: Normal     Standing balance support: No upper extremity supported Standing balance-Leahy Scale: Normal   Single Leg Stance - Right Leg: 10 Single Leg Stance - Left Leg: 10     Rhomberg - Eyes Opened: 30 Rhomberg - Eyes Closed: 30                 Pertinent Vitals/Pain Pain Assessment: 0-10 Pain Score: 3  Pain Location: Sit of tracheostomy Pain Intervention(s): Monitored during session    Home Living Family/patient expects to be discharged to:: Private residence Living Arrangements: Spouse/significant other;Children;Parent  Available Help at Discharge: Family Type of Home: Apartment Home Access: Stairs to enter Entrance Stairs-Rails: Can reach both Entrance Stairs-Number of Steps: 2 Home Layout: Two level;Bed/bath upstairs Home  Equipment: None      Prior Function Level of Independence: Independent         Comments: Denies falls in the lasgt 12 months. Fully independent with ambulation, ADLs, IADLs     Hand Dominance   Dominant Hand: Left    Extremity/Trunk Assessment   Upper Extremity Assessment: Overall WFL for tasks assessed           Lower Extremity Assessment: Overall WFL for tasks assessed         Communication   Communication: Tracheostomy (Yes/no questioning, lip reading, and white board)  Cognition Arousal/Alertness: Awake/alert Behavior During Therapy: WFL for tasks assessed/performed Overall Cognitive Status: Within Functional Limits for tasks assessed                      General Comments      Exercises        Assessment/Plan    PT Assessment Patent does not need any further PT services  PT Diagnosis     PT Problem List    PT Treatment Interventions     PT Goals (Current goals can be found in the Care Plan section)      Frequency     Barriers to discharge        Co-evaluation               End of Session Equipment Utilized During Treatment: Gait belt;Oxygen (6 L/min) Activity Tolerance: Patient tolerated treatment well Patient left: in bed;with call bell/phone within reach;with bed alarm set;with family/visitor present Nurse Communication: Mobility status         Time: 1540-1605 PT Time Calculation (min) (ACUTE ONLY): 25 min   Charges:   PT Evaluation $PT Eval Low Complexity: 1 Procedure PT Treatments $Gait Training: 8-22 mins   PT G Codes:       [name] PT, DPT   [name] 10/19/2015, 5:01 PM

## 2015-10-19 NOTE — Progress Notes (Addendum)
.. 10/19/2015 5:01 PM  Posey Pronto, Dodd [id number]  Post-Op Day 2    Temp:  [98.8 F (37.1 C)-99.8 F (37.7 C)] 98.8 F (37.1 C) (02/01 1258) Pulse Rate:  [66-76] 71 (02/01 1410) Resp:  [14-19] 17 (02/01 1258) BP: (86-101)/(43-56) 101/56 mmHg (02/01 1410) SpO2:  [98 %-100 %] 98 % (02/01 1616) FiO2 (%):  [28 %-100 %] 28 % (02/01 1616),     Intake/Output Summary (Last 24 hours) at 10/19/15 1701 Last data filed at 10/19/15 1608  Gross per 24 hour  Intake   1744 ml  Output   1200 ml  Net    544 ml    Results for orders placed or performed during the hospital encounter of 10/17/15 (from the past 24 hour(s))  CBC with Differential/Platelet     Status: Abnormal   Collection Time: 10/18/15  6:37 PM  Result Value Ref Range   WBC 14.9 (H) 3.8 - 10.6 K/uL   RBC 4.61 4.40 - 5.90 MIL/uL   Hemoglobin 13.4 13.0 - 18.0 g/dL   HCT 40.4 40.0 - 52.0 %   MCV 87.8 80.0 - 100.0 fL   MCH 29.1 26.0 - 34.0 pg   MCHC 33.2 32.0 - 36.0 g/dL   RDW 11.9 11.5 - 14.5 %   Platelets 331 150 - 440 K/uL   Neutrophils Relative % 69 %   Neutro Abs 10.4 (H) 1.4 - 6.5 K/uL   Lymphocytes Relative 13 %   Lymphs Abs 1.9 1.0 - 3.6 K/uL   Monocytes Relative 6 %   Monocytes Absolute 0.9 0.2 - 1.0 K/uL   Eosinophils Relative 11 %   Eosinophils Absolute 1.6 (H) 0 - 0.7 K/uL   Basophils Relative 1 %   Basophils Absolute 0.1 0 - 0.1 K/uL  Basic metabolic panel     Status: Abnormal   Collection Time: 10/18/15  6:37 PM  Result Value Ref Range   Sodium 135 135 - 145 mmol/L   Potassium 4.4 3.5 - 5.1 mmol/L   Chloride 101 101 - 111 mmol/L   CO2 29 22 - 32 mmol/L   Glucose, Bld 116 (H) 65 - 99 mg/dL   BUN 6 6 - 20 mg/dL   Creatinine, Ser 0.83 0.61 - 1.24 mg/dL   Calcium 8.8 (L) 8.9 - 10.3 mg/dL   GFR calc non Af Amer >60 >60 mL/min   GFR calc Af Amer >60 >60 mL/min   Anion gap 5 5 - 15  Basic metabolic panel     Status: Abnormal   Collection Time: 10/19/15  4:27 AM  Result Value Ref Range   Sodium 135  135 - 145 mmol/L   Potassium 3.4 (L) 3.5 - 5.1 mmol/L   Chloride 101 101 - 111 mmol/L   CO2 26 22 - 32 mmol/L   Glucose, Bld 94 65 - 99 mg/dL   BUN 10 6 - 20 mg/dL   Creatinine, Ser 0.83 0.61 - 1.24 mg/dL   Calcium 8.6 (L) 8.9 - 10.3 mg/dL   GFR calc non Af Amer >60 >60 mL/min   GFR calc Af Amer >60 >60 mL/min   Anion gap 8 5 - 15  Glucose, capillary     Status: None   Collection Time: 10/19/15  7:55 AM  Result Value Ref Range   Glucose-Capillary 76 65 - 99 mg/dL   Comment 1 Notify RN     SUBJECTIVE:  Pt tolerating trach cuff deflated.  OBJECTIVE:  GEN- NAD Neck- trach secure and patent, cuff deflated,  trach collar in place  IMPRESSION:  S/p trach pod #2  PLAN:  1)  Speech evaluated but no note in chart yet.  Patient was tolerating regular diet prior to tracheostomy tube placement per patient and wife and did not have any signs/symptoms of aspiration. 2)  Apparently patient did not pass PMV per nurse 3)  Continue Levaquin abx day 2 and consistent with early infiltrate on PET 4)  Reviewed PET scan with patient and family which revealed right neck lymph node and mass but no metastatic disease 5)  Needs home health and trach supplies 6)  Trach teaching to patient and family 7)  Unsure if patient will be able to be discharged tomorrow/Friday for appt at [facility] as not has had trial of PO and limited ability to handle trach. 8)  ADDENDUM- trial of ice chips and water tried with patient at bedside.  Tolerated liquid and ice chips in small sips with no evidence of aspiration or coughing.  Given 3 days of nothing by mouth will advance with small amount of applesauce/pudding consistency tonight.  If no evidence of difficulty will place on soft diet given the fact that patient was tolerating normal food consistency prior to trach placement.  If any evidence of aspiration, will need NGT placed and consideration of PEG.   Lonny Eisen 10/19/2015, 5:01 PM

## 2015-10-20 ENCOUNTER — Inpatient Hospital Stay: Payer: BLUE CROSS/BLUE SHIELD | Admitting: Oncology

## 2015-10-20 ENCOUNTER — Inpatient Hospital Stay: Payer: BLUE CROSS/BLUE SHIELD

## 2015-10-20 MED ORDER — BOOST / RESOURCE BREEZE PO LIQD
1.0000 | Freq: Three times a day (TID) | ORAL | Status: DC
  Administered 2015-10-21 – 2015-10-24 (×10): 1 via ORAL

## 2015-10-20 MED ORDER — HYDROCODONE-ACETAMINOPHEN 7.5-325 MG/15ML PO SOLN
15.0000 mL | ORAL | Status: DC | PRN
  Administered 2015-10-21: 15 mL via ORAL
  Filled 2015-10-20: qty 15

## 2015-10-20 NOTE — Plan of Care (Signed)
Problem: SLP Dysphagia Goals Goal: Misc Dysphagia Goal Pt will safely tolerate po diet of least restrictive consistency w/ no overt s/s of aspiration noted by Staff/pt/family x3 sessions.    

## 2015-10-20 NOTE — Progress Notes (Signed)
Speech Therapy Note: received order, reviewed chart notes then met w/ pt. Discussed and explained SLP's role and the assessment for PMV(speaking valve) use for verbal communication. Briefly discussed swallowing but will focus on this later based on results of PMV evaluation. Pt does have cuff deflated this PM and is tolerating this well per pt and NSG.  ST will f/u tomorrow for PMV if pt is able to tolerate cuff deflation for 24 hours w/out distress or discomfort. NSG updated and agreed.

## 2015-10-20 NOTE — Progress Notes (Signed)
..   10/20/2015 9:07 AM  Posey Pronto, Bobby Randolph [id number]  Post-Op Day 3    Temp:  [98.8 F (37.1 C)-99.1 F (37.3 C)] 98.9 F (37.2 C) (02/02 0448) Pulse Rate:  [66-77] 75 (02/02 0448) Resp:  [17-20] 20 (02/02 0448) BP: (86-120)/(43-66) 111/60 mmHg (02/02 0448) SpO2:  [98 %-100 %] 100 % (02/02 0750) FiO2 (%):  [28 %] 28 % (02/02 0750),     Intake/Output Summary (Last 24 hours) at 10/20/15 0907 Last data filed at 10/20/15 0809  Gross per 24 hour  Intake   1948 ml  Output   2025 ml  Net    -77 ml    No results found for this or any previous visit (from the past 24 hour(s)).  SUBJECTIVE:  No acute events overnight.  Tolerating applesauce and sips of water without any signs of coughing or aspiration.  Patient and wife report that they would like to try and advance diet given he was on a regular diet prior to procedure.  Pain controlled.  Ambulating.  OBJECTIVE:   GEN- NAD NECK- continued right neck mass, trach secure and patent with improved secretions with cuff deflated.  IMPRESSION:  S/p trach pod#3 [id number] SCCA of larynx  PLAN:  1)  Advance diet to soft mechanical 2)  Levaquin day 2 3)  Oral pain medications 4)  Continue trach teaching to family 5)  Continue ambulation 6)  Per care manager note, home health being set up 7)  Possible discharge tomorrow to [facility] for outpatient appointment in a.m. If patient/family gets trach teaching, home health, and tolerates PO.   Bobby Randolph 10/20/2015, 9:07 AM

## 2015-10-20 NOTE — Progress Notes (Signed)
Patient was able to eat a few spoonfuls of applesauce and drink several sips of water without any complications.  Christene Slates  10/20/2015  7:33 AM

## 2015-10-20 NOTE — Evaluation (Signed)
Objective Swallowing Evaluation: Type of Study: MBS-Modified Barium Swallow Study  Patient Details  Name: Bobby Randolph MRN: [id number] Date of Birth: Feb 06, 1971  Today's Date: 10/20/2015 Time: SLP Start Time (ACUTE ONLY): 1400-SLP Stop Time (ACUTE ONLY): 1500 SLP Time Calculation (min) (ACUTE ONLY): 60 min  Past Medical History:  Past Medical History  Diagnosis Date  . Dysphagia   . Weight loss, unintentional   . GERD (gastroesophageal reflux disease)     RELATED TO SPICY FOODS-NO MEDS  . Laryngeal mass   . Cancer [facility])    Past Surgical History:  Past Surgical History  Procedure Laterality Date  . Tooth removal  09/01/2015    right upper wisdom tooth  . Wisdom tooth extraction    . Neck lesion biopsy    . Tracheostomy tube placement N/A 10/17/2015    Procedure: TRACHEOSTOMY;  Surgeon: Carloyn Manner, MD;  Location: [facility] ORS;  Service: ENT;  Laterality: N/A;  . Laryngoscopy  10/17/2015    Procedure: DIAGNOSTIC LARYNGOSCOPY;  Surgeon: Carloyn Manner, MD;  Location: [facility] ORS;  Service: ENT;;   HPI: Patient is a 45 year old male who was in his usual state of health until he had some teeth pulled approximately one month ago. He noticed an enlarging lump on the side of his neck approximately 2 weeks ago. He was evaluated at urgent care and was given some antibiotics. He developed diarrhea from his treatments and subsequent stopped the antibiotic. The mass on his neck continued to enlarge and he subsequently went to the emergency room for further evaluation. CT scan was obtained revealing a 5 cm right neck mass. He has dysphagia. He also admits to some weight loss over the past month, but attributes this to difficulty eating and minimal PO intake. He denies any fevers or night sweats. He has no neurologic complaints. He denies any chest pain or shortness of breath. He denies any nausea, vomiting, constipation, or diarrhea. He has no urinary complaints. Patient otherwise feels well and  offers no further specific complaints. Pt was seen by Oncology and ENT given the dx of [id number] SCCA of right larynx/piriform sinus for which ENT performed a tracheostomy on 1.30.17. Currently, pt is tolerating cuff deflation post op day 3. Pt does have a Shiley #8 trach in place. He was unable to tolerate finger occlusion or PMV use/placement w/out discomfort during exhalation(rec. consideratoin of downsizing trach when appropriate then reattempt PMV use). Pt has been NPO but tried ice chips today w/ MD.   Subjective: pt awake/alert; aphonic sec. to tracheostomy  Assessment / Plan / Recommendation  CHL IP CLINICAL IMPRESSIONS 10/20/2015  Therapy Diagnosis Moderate pharyngeal phase dysphagia  Clinical Impression Pt appears to present w/ increased risk for aspiration w/ po consistencies sec. to the moderate pharyngeal residue in the hypopharynx at the level of/below the valleculae extending to the pyriform sinuses post initial swallow. Tissue changes of the Pharynx have been noted per PET scan and appear to be impacting the efficiency and safety of the swallow. Pt's need for a tracheostomy w/ little/no air movement above the level of the trach through the vocal cords impedes his ability to cough for airway protection. Pt appeared able to manage single sips of thin liquids and liquid/moist food best using strategies of effortful, multiple swallows to decreased the pharyngeal residue. Slight, silent laryngeal penetration of the remaining pharyngeal residue was noted b/t trials. The results of this exam were shared w/ pt's ENT who agreed w/ initation of an oral diet - Full Liquids. Aspiration  precautions. Rec. ST f/u for further education on aspiration precautions and monitoring for any decline in respiratory status as pt awaits discharge to [facility] for further assesment/evaluation of the neck mass and potential tx options/poc.   Impact on safety and function (No Data)      CHL IP TREATMENT RECOMMENDATION 10/20/2015   Treatment Recommendations Therapy as outlined in treatment plan below     Prognosis 10/20/2015  Prognosis for Safe Diet Advancement Fair  Barriers to Reach Goals (No Data)  Barriers/Prognosis Comment --    CHL IP DIET RECOMMENDATION 10/20/2015  SLP Diet Recommendations (No Data)  Liquid Administration via Cup  Medication Administration Crushed with puree  Compensations Minimize environmental distractions;Slow rate;Small sips/bites;Multiple dry swallows after each bite/sip;Follow solids with liquid  Postural Changes Seated upright at 90 degrees      CHL IP OTHER RECOMMENDATIONS 10/20/2015  Recommended Consults (No Data)  Oral Care Recommendations Oral care BID;Patient independent with oral care  Other Recommendations --      CHL IP FOLLOW UP RECOMMENDATIONS 10/20/2015  Follow up Recommendations Home health SLP;Outpatient SLP      CHL IP FREQUENCY AND DURATION 10/20/2015  Speech Therapy Frequency (ACUTE ONLY) min 3x week  Treatment Duration 2 weeks           CHL IP ORAL PHASE 10/20/2015  Oral Phase WFL  Oral - Pudding Teaspoon --  Oral - Pudding Cup --  Oral - Honey Teaspoon --  Oral - Honey Cup --  Oral - Nectar Teaspoon --  Oral - Nectar Cup --  Oral - Nectar Straw --  Oral - Thin Teaspoon --  Oral - Thin Cup --  Oral - Thin Straw --  Oral - Puree --  Oral - Mech Soft --  Oral - Regular --  Oral - Multi-Consistency --  Oral - Pill --  Oral Phase - Comment --    CHL IP PHARYNGEAL PHASE 10/20/2015  Pharyngeal Phase Impaired  Pharyngeal- Pudding Teaspoon --  Pharyngeal --  Pharyngeal- Pudding Cup --  Pharyngeal --  Pharyngeal- Honey Teaspoon --  Pharyngeal --  Pharyngeal- Honey Cup --  Pharyngeal --  Pharyngeal- Nectar Teaspoon --  Pharyngeal --  Pharyngeal- Nectar Cup --  Pharyngeal --  Pharyngeal- Nectar Straw --  Pharyngeal --  Pharyngeal- Thin Teaspoon --  Pharyngeal --  Pharyngeal- Thin Cup --  Pharyngeal --  Pharyngeal- Thin Straw --  Pharyngeal --   Pharyngeal- Puree --  Pharyngeal --  Pharyngeal- Mechanical Soft --  Pharyngeal --  Pharyngeal- Regular --  Pharyngeal --  Pharyngeal- Multi-consistency --  Pharyngeal --  Pharyngeal- Pill --  Pharyngeal --  Pharyngeal Comment pt appeared to have fair timing of the pharyngeal swallow w/ trials given; moderate pharyngeal residue noted in the hypopharynx in/around tissue changes noted in the pharynx at the level of/below the valleculae extending to the pyriform sinuses. Pt attempted f/u swallows to reduce this pharyngeal residue which did not completely clear. Due to the degree of residue initially during the swallow, a slight amount of laryngeal penetration began to occur - Silent in nature. No immediate aspiration was noted during this exam.      CHL IP CERVICAL ESOPHAGEAL PHASE 10/20/2015  Cervical Esophageal Phase WFL  Pudding Teaspoon --  Pudding Cup --  Honey Teaspoon --  Honey Cup --  Nectar Teaspoon --  Nectar Cup --  Nectar Straw --  Thin Teaspoon --  Thin Cup --  Thin Straw --  Puree --  Mechanical Soft --  Regular --  Multi-consistency --  Pill --  Cervical Esophageal Comment --     Orinda Kenner, MS, CCC-SLP  Watson,Katherine 10/20/2015, 7:28 PM

## 2015-10-20 NOTE — Evaluation (Signed)
[facility] Speaking [facility]ve - Evaluation Patient Details  Name: Bobby Randolph MRN: [id number] Date of Birth: 1971/02/27  Today's Date: 10/20/2015 Time: 1110-1210 SLP Time Calculation (min) (ACUTE ONLY): 60 min  Past Medical History:  Past Medical History  Diagnosis Date  . Dysphagia   . Weight loss, unintentional   . GERD (gastroesophageal reflux disease)     RELATED TO SPICY FOODS-NO MEDS  . Laryngeal mass   . Cancer [facility])    Past Surgical History:  Past Surgical History  Procedure Laterality Date  . Tooth removal  09/01/2015    right upper wisdom tooth  . Wisdom tooth extraction    . Neck lesion biopsy    . Tracheostomy tube placement N/A 10/17/2015    Procedure: TRACHEOSTOMY;  Surgeon: Carloyn Manner, MD;  Location: [facility] ORS;  Service: ENT;  Laterality: N/A;  . Laryngoscopy  10/17/2015    Procedure: DIAGNOSTIC LARYNGOSCOPY;  Surgeon: Carloyn Manner, MD;  Location: [facility] ORS;  Service: ENT;;   HPI:  Patient is a 45 year old male who was in his usual state of health until he had some teeth pulled approximately one month ago. He noticed an enlarging lump on the side of his neck approximately 2 weeks ago. He was evaluated at urgent care and was given some antibiotics. He developed diarrhea from his treatments and subsequent stopped the antibiotic. The mass on his neck continued to enlarge and he subsequently went to the emergency room for further evaluation. CT scan was obtained revealing a 5 cm right neck mass. He has dysphagia. He also admits to some weight loss over the past month, but attributes this to difficulty eating and minimal PO intake. He denies any fevers or night sweats. He has no neurologic complaints. He denies any chest pain or shortness of breath. He denies any nausea, vomiting, constipation, or diarrhea. He has no urinary complaints. Patient otherwise feels well and offers no further specific complaints. Pt was seen by Oncology and ENT given the dx of T4NxMx SCCA of  right larynx/piriform sinus for which ENT performed a tracheostomy on 1.30.17. Currently, pt is tolerating cuff deflation post op day 3. Pt does have a Shiley #8 trach in place. He was unable to tolerate finger occlusion or PMV use/placement w/out discomfort during exhalation(rec. consideratoin of downsizing trach when appropriate then reattempt PMV use). Pt has been NPO but tried ice chips today w/ MD.    Assessment / Plan / Recommendation Clinical Impression  Pt appears to be unable to redirect airflow superiorly through the vocal cords during exhalation. As any finger occlusion or PMV placement continued for more than a minute, pt indicated discomfort of "breath stacking". Pt was unable phonate or blow air as well. When occlusion was stopped or PMV removed, a rush/gush of air was expelled at the level of the trach followed by coughing and expectoration of secretions at the level of the trach. Pt is unable to tolerate PMV placement at this time. Unsure if related to the size of the trach(shiley #8) or to the mass in the area of the larynx/pharynx. When appropriate, rec. downsize of trach to a Shiley #6 and reattempt PMV assessment. This evaluation was discussed w/ pt and family; MD/NSG.     SLP Assessment  All further Speech Lanaguage Pathology  needs can be addressed in the next venue of care    Follow Up Recommendations  Home health SLP;Outpatient SLP (when appropriate)    Frequency and Duration TBD when appropriate      PMSV Trial  PMSV was placed for: 1-2 mins Able to redirect subglottic air through upper airway: No Able to Attain Phonation: No Able to Expectorate Secretions: No Level of Secretion Expectoration with PMSV: Tracheal Breath Support for Phonation: Inadequate Intelligibility: Unable to assess (comment) Respirations During Trial: 22 SpO2 During Trial: 100 % Pulse During Trial: 71 Behavior: Alert;Good eye contact;Cooperative;Expresses self well (gestures)   Tracheostomy  Tube  Additional Tracheostomy Tube Assessment Trach Collar Period: baseline currently Secretion Description: min.-mod. Frequency of Tracheal Suctioning: PRN Level of Secretion Expectoration: Tracheal    Vent Dependency  Vent Dependent: No FiO2 (%): 28 %    Cuff Deflation Trial     GO     Orinda Kenner, MS, CCC-SLP  Tolerated Cuff Deflation: Yes Length of Time for Cuff Deflation Trial: 24 hours Behavior: Alert;Cooperative;Good eye contact;Oriented X3;Expresses self well (gestures, writing) Cuff Deflation Trial - Comments: cuff deflated at baseline now        [facility] 10/20/2015, 7:42 PM

## 2015-10-20 NOTE — Progress Notes (Signed)
..   10/20/2015 5:28 PM  Posey Pronto, Devaun [id number]  Post-Op Day 3    Temp:  [98.9 F (37.2 C)-99.1 F (37.3 C)] 99.1 F (37.3 C) (02/02 1452) Pulse Rate:  [69-77] 69 (02/02 1452) Resp:  [17-20] 17 (02/02 1452) BP: (109-120)/(60-66) 109/62 mmHg (02/02 1452) SpO2:  [100 %] 100 % (02/02 1452) FiO2 (%):  [28 %] 28 % (02/02 0910),     Intake/Output Summary (Last 24 hours) at 10/20/15 1728 Last data filed at 10/20/15 1100  Gross per 24 hour  Intake   1321 ml  Output   1475 ml  Net   -154 ml    No results found for this or any previous visit (from the past 24 hour(s)).  SUBJECTIVE:  Passed swallowing study with liquid consistency.  Difficulty with clearing solids.  Eating ice cream currently.  Pain controlled with medications.  Minimal drainage.  Got some trach teaching with respiratory therapy today.  OBJECTIVE:  GEN- NAD, sitting in bed eating ice cream  NECK-  Trach secure and patent, cuff deflated  IMPRESSION:  S/p trach for T4N1M0 SCCA of right larynx/piriform sinus HPV negative POD#3  PLAN:  1)  Levaquin day 2 2)  Changed appt at [facility] to next week to ensure improved trach teaching 3)  Encouraged PO intake 4)  Encourage ambulation 5)  Continue trach teaching with patient and family 6)  Plan on changing trach on POD#5 to cuffless Size 8 Shiley at bedside 7)  Home health for when discharged  [facility] 10/20/2015, 5:28 PM

## 2015-10-21 LAB — CBC WITH DIFFERENTIAL/PLATELET
BASOS PCT: 1 %
Basophils Absolute: 0.1 10*3/uL (ref 0–0.1)
EOS PCT: 30 %
Eosinophils Absolute: 3.3 10*3/uL — ABNORMAL HIGH (ref 0–0.7)
HEMATOCRIT: 37.2 % — AB (ref 40.0–52.0)
HEMOGLOBIN: 12.4 g/dL — AB (ref 13.0–18.0)
LYMPHS PCT: 15 %
Lymphs Abs: 1.6 10*3/uL (ref 1.0–3.6)
MCH: 28.7 pg (ref 26.0–34.0)
MCHC: 33.4 g/dL (ref 32.0–36.0)
MCV: 86 fL (ref 80.0–100.0)
MONOS PCT: 6 %
Monocytes Absolute: 0.7 10*3/uL (ref 0.2–1.0)
NEUTROS PCT: 48 %
Neutro Abs: 5.2 10*3/uL (ref 1.4–6.5)
Platelets: 338 10*3/uL (ref 150–440)
RBC: 4.33 MIL/uL — ABNORMAL LOW (ref 4.40–5.90)
RDW: 11.6 % (ref 11.5–14.5)
WBC: 10.9 10*3/uL — ABNORMAL HIGH (ref 3.8–10.6)

## 2015-10-21 LAB — BASIC METABOLIC PANEL
Anion gap: 8 (ref 5–15)
BUN: 6 mg/dL (ref 6–20)
CHLORIDE: 104 mmol/L (ref 101–111)
CO2: 26 mmol/L (ref 22–32)
Calcium: 9.1 mg/dL (ref 8.9–10.3)
Creatinine, Ser: 0.83 mg/dL (ref 0.61–1.24)
GFR calc non Af Amer: >60 mL/min (ref >60)
Glucose, Bld: 129 mg/dL — ABNORMAL HIGH (ref 65–99)
POTASSIUM: 4.6 mmol/L (ref 3.5–5.1)
SODIUM: 138 mmol/L (ref 135–145)

## 2015-10-21 MED ORDER — ZOLPIDEM TARTRATE 5 MG PO TABS
10.0000 mg | ORAL_TABLET | Freq: Every day | ORAL | Status: DC
  Administered 2015-10-21: 10 mg via ORAL
  Filled 2015-10-21: qty 2

## 2015-10-21 MED ORDER — LEVOFLOXACIN 500 MG PO TABS
500.0000 mg | ORAL_TABLET | Freq: Every day | ORAL | Status: DC
  Administered 2015-10-21 – 2015-10-24 (×4): 500 mg via ORAL
  Filled 2015-10-21 (×5): qty 1

## 2015-10-21 NOTE — Care Management (Signed)
Potential for discharge over the weekend.  Patient will need home health nursing at time of discharge.  Patient and wife provided with list of home health agencies.  They do not have a preference of agencies.  Referral made to [facility].  Jason with [facility], patient will need to be seen within 24 hours of discharge.  Patient is currently requiring O2 via trach collar.  Patient will need to be assess for need of home O2.  Bedside nursing notified.  Will from [facility] notified of potential need, and is to provide me with required paper work.  RNCM following

## 2015-10-21 NOTE — Progress Notes (Signed)
Dr. Pryor Ochoa at the bedside and patient ambulated around nurses station twice without oxygen and oxygen saturation 99% on room air, Patient wife cleaned trach site and replaced gauze without assistance.

## 2015-10-21 NOTE — Care Management (Addendum)
Patient will need to be seen within 24 hours of discharge by home health.  Corene Cornea from [facility] notified me that they will not be able to see the patient with in this time frame.  Spoke with Brittney from [facility] and they will be able to see the patient within 24 hours.  Heads up Referral made to [facility], information will need to be faxed at time of discharge.  Tracheostomy Referral form for [facility] completed by MD and on chart.  Per MD patient will likely discharge Sunday, and will not require O2.

## 2015-10-21 NOTE — Progress Notes (Signed)
..   10/21/2015 12:34 PM  Bobby Randolph, Bobby Randolph [id number]  Post-Op Day #4    Temp:  [98.3 F (36.8 C)-100.2 F (37.9 C)] 98.5 F (36.9 C) (02/03 1026) Pulse Rate:  [58-69] 58 (02/03 1026) Resp:  [16-18] 16 (02/03 1026) BP: (107-112)/(62-69) 112/69 mmHg (02/03 1026) SpO2:  [98 %-100 %] 100 % (02/03 1026) FiO2 (%):  [28 %] 28 % (02/02 2010),     Intake/Output Summary (Last 24 hours) at 10/21/15 1234 Last data filed at 10/21/15 0521  Gross per 24 hour  Intake   2501 ml  Output    950 ml  Net   1551 ml    Results for orders placed or performed during the hospital encounter of 10/17/15 (from the past 24 hour(s))  Basic metabolic panel     Status: Abnormal   Collection Time: 10/21/15  4:16 AM  Result Value Ref Range   Sodium 138 135 - 145 mmol/L   Potassium 4.6 3.5 - 5.1 mmol/L   Chloride 104 101 - 111 mmol/L   CO2 26 22 - 32 mmol/L   Glucose, Bld 129 (H) 65 - 99 mg/dL   BUN 6 6 - 20 mg/dL   Creatinine, Ser 0.83 0.61 - 1.24 mg/dL   Calcium 9.1 8.9 - 10.3 mg/dL   GFR calc non Af Amer >60 >60 mL/min   GFR calc Af Amer >60 >60 mL/min   Anion gap 8 5 - 15  CBC with Differential/Platelet     Status: Abnormal   Collection Time: 10/21/15  4:16 AM  Result Value Ref Range   WBC 10.9 (H) 3.8 - 10.6 K/uL   RBC 4.33 (L) 4.40 - 5.90 MIL/uL   Hemoglobin 12.4 (L) 13.0 - 18.0 g/dL   HCT 37.2 (L) 40.0 - 52.0 %   MCV 86.0 80.0 - 100.0 fL   MCH 28.7 26.0 - 34.0 pg   MCHC 33.4 32.0 - 36.0 g/dL   RDW 11.6 11.5 - 14.5 %   Platelets 338 150 - 440 K/uL   Neutrophils Relative % 48 %   Lymphocytes Relative 15 %   Monocytes Relative 6 %   Eosinophils Relative 30 %   Basophils Relative 1 %   Neutro Abs 5.2 1.4 - 6.5 K/uL   Lymphs Abs 1.6 1.0 - 3.6 K/uL   Monocytes Absolute 0.7 0.2 - 1.0 K/uL   Eosinophils Absolute 3.3 (H) 0 - 0.7 K/uL   Basophils Absolute 0.1 0 - 0.1 K/uL    SUBJECTIVE:  No acute events.  Limited PO per nursing despite good MBSS and encouragement for PO.  Will continue to  follow.  Limited ambulation per wife despite encouragement to walk and get out of bed.  OBJECTIVE:   GEN- supine in bed, NAD NECK- continued right sided neck mass, trach secure and patent with minimal drainage  IMPRESSION:  S/p tracheostomy tube placement POD#4 for T4NaN1M0 SCCA of larynx  PLAN:  1)  Patient's wife reports has not ambulated except for bathroom, again encouraged patient to get out of bed and ambulate!! 2)  Patients wife reports that he has not taken much PO despite speech working with patient yesterday and good swallowing study 3)  Plan to change trach to cuffless tomorrow on POD#5 4)  PT to ambulate patient as patient will not ambulate on own  Attie Nawabi 10/21/2015, 12:34 PM

## 2015-10-22 MED ORDER — ZOLPIDEM TARTRATE 5 MG PO TABS
10.0000 mg | ORAL_TABLET | Freq: Every evening | ORAL | Status: DC | PRN
  Administered 2015-10-22: 10 mg via ORAL
  Filled 2015-10-22: qty 2

## 2015-10-22 NOTE — Progress Notes (Signed)
..   10/22/2015 9:15 AM  Bobby Randolph, Ryo [id number]  Post-Op Day 5    Temp:  [98.5 F (36.9 C)-99.9 F (37.7 C)] 99.2 F (37.3 C) (02/04 0815) Pulse Rate:  [58-86] 66 (02/04 0815) Resp:  [16-24] 18 (02/04 0815) BP: (106-112)/(55-73) 112/72 mmHg (02/04 0815) SpO2:  [97 %-100 %] 97 % (02/04 0815),     Intake/Output Summary (Last 24 hours) at 10/22/15 0915 Last data filed at 10/22/15 0833  Gross per 24 hour  Intake    500 ml  Output      0 ml  Net    500 ml    No results found for this or any previous visit (from the past 24 hour(s)).  SUBJECTIVE:  No acute events.  Ambulated around nursing station yesterday multiple times.  Tolerated oral levaquin.  Tolerated oral pain medication.  Needed ambien last night for sleeping.  Wishes to be discharged home.  Discussed last night with brother in [place] who feels that patient is being delayed in treatment due to not being discharged.  Brother also questions tracheostomy tube placement despite non-intubatable airway with impending respiratory compromise due to T4 cancer with 95% airway obstruction and right vocal cord paralysis.  Discussed with family friend Neurologist today who was under the impression this was a small cancer of the oral cavity.  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  After discussion, he says he will relay information with the patient and his family.  This was after two other separate phone calls with his brother in [place] first on 10/14/15 explaining plan and need for trach and other on 10/18/15 for 45 minutes again discussing plan and need for trach and goal of [facility] appointment on Friday to see if this is surgically resectable.  OBJECTIVE:   GEN- NAD supine in bed NECK- continued large right sided neck mass.  Size 8 cuffed shiley in place and patent with  minimal secretions.  Procedure:  Trach change.  After verbal consent was obtained by patient and wife, patient placed supine in bed.  Dale collar removed and cuffed 8 shiley removed from patient's tracheostoma.  This revealed widely patent tracheostoma with minimal secretions.  Size 8 uncuffed shiley placed without difficulty and inner cannula placed.  Patient with expected post-change coughing and very slight amount of blood tinged mucus.  IMPRESSION:  S/p Trach POD#5 for Stage IVa SCCA of Larynx  PLAN:  Discharge home health order placed.  Home health form placed on chart.  Per home health notes, agency already selected and aware.  Order placed for patient to be seen in 24 hours by home health agency.  Piccola Arico 10/22/2015, 9:15 AM

## 2015-10-22 NOTE — Progress Notes (Signed)
Pt ambulated around the nursing station without O2 and his 02 stat was 96%. RN educated pt and wife on how to suction the trach and how to perform trach care. Wife demonstrated the teach back method. Also, pt drank a total of 500 ml of water and ate four spoon fulls of pudding/applesauce throughout the night. Will continue to encourage ambulation, food and water intake.   Angus Seller

## 2015-10-22 NOTE — Progress Notes (Signed)
RN notified Dr. Richardson Landry, concerning that there is no order for pt not to have an IV access and to discontinue fluids, for which this was told during shift report. New orders place for pt not to have an IV access and to discontinue fluids at this time.  Angus Seller

## 2015-10-23 NOTE — Care Management Note (Addendum)
Case Management Note  Patient Details  Name: Spenser Moczygemba MRN: [id number] Date of Birth: [birth year]  Subjective/Objective:         [facility] was notified that Mr Swagerty will be discharged 10/24/15 and will need to be seen for a home health visit on Monday at his home. Cinda Quest at [facility] agreed with this plan to have a [facility] nurse see Mr Mastrogiovanni in his home Monday afternoon. All forms and MD prescriptions for new tracheostomy equipment were called and faxed to [place]. Current discharge plan is for discharge home tomorow if all new tracheostomy equipment for home use is available from [facility]. Barbaraann Rondo from [facility] called and reported that these orders for new tracheostomy suction equipment must be reviewed by the [facility] Respiratory Team which may take from 24 to 48 hours. The Respiratory team is not available on weekends. Barbaraann Rondo will submit the request to the [facility] team on Monday 10/24/15.            Action/Plan:   Expected Discharge Date:                  Expected Discharge Plan:     In-House Referral:     Discharge planning Services     Post Acute Care Choice:    Choice offered to:     DME Arranged:    DME Agency:     HH Arranged:    [place] Agency:     Status of Service:     Medicare Important Message Given:    Date Medicare IM Given:    Medicare IM give by:    Date Additional Medicare IM Given:    Additional Medicare Important Message give by:     If discussed at [place] of Stay Meetings, dates discussed:    Additional Comments:  Kiyomi Pallo A, RN 10/23/2015, 9:36 AM

## 2015-10-23 NOTE — Progress Notes (Signed)
..  10/23/2015 8:24 AM  Posey Pronto, Dequann [id number]  Post-Op Day 6    Temp:  [98.8 F (37.1 C)-99.8 F (37.7 C)] 98.9 F (37.2 C) (02/05 0558) Pulse Rate:  [70-81] 70 (02/05 0558) Resp:  [22] 22 (02/04 1658) BP: (101-114)/(51-72) 110/61 mmHg (02/05 0558) SpO2:  [97 %-98 %] 97 % (02/05 0558),     Intake/Output Summary (Last 24 hours) at 10/23/15 0824 Last data filed at 10/22/15 2135  Gross per 24 hour  Intake     30 ml  Output      0 ml  Net     30 ml    No results found for this or any previous visit (from the past 24 hour(s)).  SUBJECTIVE:  No acute events overnight.  Trach changed to cuffless yesterday.  Reports increased coughing last night.  Reports that feels needs 1 more day/night to feel comfortable with trach.  Patient able to phonate around cuffless tracheostomy tube.  OBJECTIVE:   GEN- NAD, supine in bed resting comfortably NECK- Trach secure and patent  IMPRESSION:  S/p tracheostomy POD #6 for a Stage IV SCCA of larynx ready for discharge tomorrow morning.  PLAN:  1)  Continue levaquin for infiltrate seen on PET, WBC decreased.  Total of 10 day course.  Today is #5. 2)  Patient has appointment on Friday at [facility] for evaluation, will also set up Oncology appointment with Dr. Grayland Ormond as well as appointment with Dr. Donella Stade at [facility] as well. 3)  Continue full liquid diet as instructed by Speech.  Continue boost/ensure supplements at least 3x a day 4)  Home health was set up Friday and order placed yesterday as well. Will need supplies at home for discharge tomorrow. 5)  Patient will need upon discharge:  Trach suction machine, 10 14 French suction catheters, Trach cleaning kit.  4 Disposable inner cannulas, 1 Shiley Cuffless Size #8 trach, 1 Shiley Cuffless Size #6 trach, previous trach obturator.  [place].  Matej Sappenfield 10/23/2015, 8:24 AM

## 2015-10-24 MED ORDER — LEVOFLOXACIN 500 MG PO TABS: 500.0000 mg | ORAL_TABLET | Freq: Every day | ORAL | Status: AC

## 2015-10-24 MED ORDER — HYDROCODONE-ACETAMINOPHEN 7.5-325 MG/15ML PO SOLN: 15.0000 mL | ORAL | Status: AC | PRN

## 2015-10-24 MED ORDER — BOOST / RESOURCE BREEZE PO LIQD: 1.0000 | Freq: Three times a day (TID) | ORAL | Status: AC

## 2015-10-24 NOTE — Progress Notes (Signed)
Pt d/c to home today with tracheostomy in place.  Trach care instruction and education provided with teach back.  Trach suction machine with tubing provided and placed in patients room for d/c home and education was provided on how to use machine.  Pt also given Rx's for 14 Fr suction catheters, #6 and #8 shiley trachs, yankaure suction tips, and trach care kits.  Pt given  Rx's given to pt w/all questions and concerns addressed.  D/C paperwork reviewed and education provided with all questions and concerns addressed.  Pt wife at bedside for home transport.  Informed pt that a respiratory therapist is scheduled to be at their home around 1800.

## 2015-10-24 NOTE — Care Management (Signed)
Patient to discharge today.  Tracheostomy referral has been approved by [facility].  RT to come to home this evening at 6pm.  Bedside RN has review equipment list to ensure that patient has all required equipment at time of discharge.  [facility] has been notified of pending discharge.

## 2015-10-24 NOTE — Progress Notes (Signed)
Pt refused labs this AM.

## 2015-10-24 NOTE — Final Progress Note (Cosign Needed)
..   10/24/2015 8:12 AM  [name], Bobby [id number]  Post-Op Day 7    Temp:  [98.4 F (36.9 C)-99.3 F (37.4 C)] 98.6 F (37 C) (02/06 0512) Pulse Rate:  [66-77] 77 (02/06 0512) Resp:  [12-20] 16 (02/06 0512) BP: (101-121)/(61-104) 101/61 mmHg (02/06 0512) SpO2:  [96 %-100 %] 100 % (02/06 0512),     Intake/Output Summary (Last 24 hours) at 10/24/15 0812 Last data filed at 10/23/15 1700  Gross per 24 hour  Intake    200 ml  Output      0 ml  Net    200 ml    No results found for this or any previous visit (from the past 24 hour(s)).  SUBJECTIVE:  No acute events.  Patient concerned yesterday with cough and home health questionable as to whether it would be ready.  Decided to discharge today.  Per case manager, [facility] home care has orders.  Order for filled out Friday PM for trach supplies for home use.  OBJECTIVE:   GEN- NAD, alert and oriented, supine in bed NECK- right neck mass stable in size, uncuffed trach secure with mild secretions  IMPRESSION:  S/p trach POD#7 for Stage IV SCCA of larynx.  PLAN:  Discharge home once supplies there.  Follow up with Oncology this week.  Consultation to Rad Onc.  Evaluation by [facility] as well.  [name] 10/24/2015, 8:12 AM

## 2015-10-25 NOTE — Discharge Summary (Signed)
Physician Discharge Summary  Patient ID: Bobby Randolph MRN: [id number] DOB/AGE: Sep 09, 1971 45 y.o.  Admit date: 10/17/2015 Discharge date: 10/25/2015  Admission Diagnoses:  Discharge Diagnoses:  Active Problems:   Keratinizing squamous cell carcinoma of larynx Bobby Randolph)   Discharged Condition: fair  Randolph Course: 45 y.o. Male found to have a T4N1M0 SCCA with impending airway obstruction.  Admitted for awake tracheostomy on 10/16/2105 due to inability to intubate because of large obstructive mass.  Mass was previously biopsied on 10/13/2015 by US guided FNA.  Final read of SCCA back prior to awake tracheostomy tube placement.  After tracheostomy tube was placed, speech therapy and respiratory therapy was consulted for evaluation and training of trach care as well swallowing evaluation.  Patient and family underwent trach care training as well as Modified barium swallow study.  Mr. Bobby Randolph passed the swallowing study and was advanced to full liquid diet.  Patient spiked a fever as well as found to have beginning infiltrate on PET scan was begun on Levaquin.  Leukocytosis was noted on CBC and this was followed and trended downward with time from steroids as well as on IV antibiotics.  Once on full liquid diet, antibiotics were changed to oral as well as pain medications changed to oral.  Patient tolerated oral feeding and Boost supplements were given as well.  The patient demonstrated ability to ambulate easily without assistance and with no oxygen desaturations.  Home health was consulted to ensure proper supplies at home on 10/21/15.  On 10/22/2015 on POD#5, the tracheostomy tube was changed to cuffless Shiley #8.  Patient continued to tolerate diet and was able to phonate around tracheostomy tube at this time.  Respiratory therapy and nursing reported that family and patient demonstrated ability to care for tracheostomy tube and decision was made to discharge the patient on 10/24/2015.  Once home health  supplies were at the home, the patient was discharged on 10/24/15 with plan for home health to come out that evening for assistance with tracheostomy care and supplies.  Consults: Speech therapy, Respiratory Therapy  Significant Diagnostic Studies: nuclear medicine: PET scan  Treatments: antibiotics: Levaquin  Discharge Exam: Blood pressure 108/65, pulse 70, temperature 98.6 F (37 C), temperature source Oral, resp. rate 18, height 5\' 8"  (1.727 m), weight 61.689 kg (136 lb), SpO2 100 %. General appearance: alert  NECK- right sided neck mass firm, size 8 cuffless shiley in place and secure with dale collar.  Minimal secretions that were cleared with cough RESP- unlabored CARD- RRR EARS- external ears WNL OC/OP- moist mucus membranes, no abnormal masses or lesions    Disposition: 01-Home or Self Care  1)  Follow up with Heme/Onc within 1 week 2)  Consultation to RAD- ONC 3)  Appointment with [facility] on 10/28/15.  Will see if can be moved up per patient wishes.  Discharge Instructions    Call MD for:  difficulty breathing, headache or visual disturbances    Complete by:  As directed      Call MD for:  persistant nausea and vomiting    Complete by:  As directed      Call MD for:  redness, tenderness, or signs of infection (pain, swelling, redness, odor or green/yellow discharge around incision site)    Complete by:  As directed      Call MD for:  severe uncontrolled pain    Complete by:  As directed      Call MD for:  temperature >100.4    Complete by:  As  directed      Change dressing (specify)    Complete by:  As directed   Dressing change: 2 times per day using drain sponges.     Diet general    Complete by:  As directed   Full liquid diet as instructed by Speech Pathology.  Supplement with Boost/Ensure drinks several times a day     Discharge instructions    Complete by:  As directed   Suction tracheostomy tube as instructed several times a day and as needed with suction catheters.   Always have spare tracheostomy tubes available.  If any respiratory distress or concern, call 911.  If tracheostomy tube falls out, needs to be replaced ASAP.     Increase activity slowly    Complete by:  As directed      Lifting restrictions    Complete by:  As directed   No heavy lifting or strenuous activity.  Avoid submerging neck in water to prevent aspiration through tracheostoma.  May bathe but do not submerge neck.            Medication List    TAKE these medications        feeding supplement Liqd  Take 1 Container by mouth 3 (three) times daily between meals.     HYDROcodone-acetaminophen 7.5-325 mg/15 ml solution  Commonly known as:  HYCET  Take 15 mLs by mouth every 4 (four) hours as needed for moderate pain.     levofloxacin 500 MG tablet  Commonly known as:  LEVAQUIN  Take 1 tablet (500 mg total) by mouth daily.         Signed: Marine Lezotte 10/25/2015, 12:26 PM

## 2015-10-26 ENCOUNTER — Inpatient Hospital Stay
Admission: RE | Admit: 2015-10-26 | Payer: BLUE CROSS/BLUE SHIELD | Source: Ambulatory Visit | Admitting: Radiation Oncology

## 2015-10-26 ENCOUNTER — Institutional Professional Consult (permissible substitution): Payer: BLUE CROSS/BLUE SHIELD | Admitting: Radiation Oncology

## 2015-10-31 ENCOUNTER — Inpatient Hospital Stay: Payer: BLUE CROSS/BLUE SHIELD | Attending: Oncology | Admitting: Oncology

## 2015-10-31 ENCOUNTER — Ambulatory Visit: Payer: BLUE CROSS/BLUE SHIELD | Attending: Radiation Oncology | Admitting: Radiation Oncology

## 2015-11-12 NOTE — Progress Notes (Deleted)
This encounter was created in error - please disregard.

## 2017-04-24 IMAGING — CT CT NECK W/ CM
2 of 3 series · 8 of 14 positions shown, 9 images · IV contrast (omnipaque)
Comparison: Thyroid ultrasound earlier today

ADDENDUM:
Partially visualized subcentimeter ground-glass opacities in the
right upper lobe. Further evaluation with chest CT recommended to
assess for infection/inflammation versus metastatic disease.
CLINICAL DATA: Right-sided sore throat and right-sided neck
swelling for 1 month. Abnormal ultrasound.

EXAM:
CT NECK WITH CONTRAST
TECHNIQUE: Multidetector CT imaging of the neck was performed using the
standard protocol following the bolus administration of intravenous
contrast.
CONTRAST:  75mL OMNIPAQUE IOHEXOL 300 MG/ML  SOLN

[Series 2: axial neck · axial · 0.52mm/px · z∈[-262,-100]mm · 4 of 137 slices shown]
[im 28/137  bone]
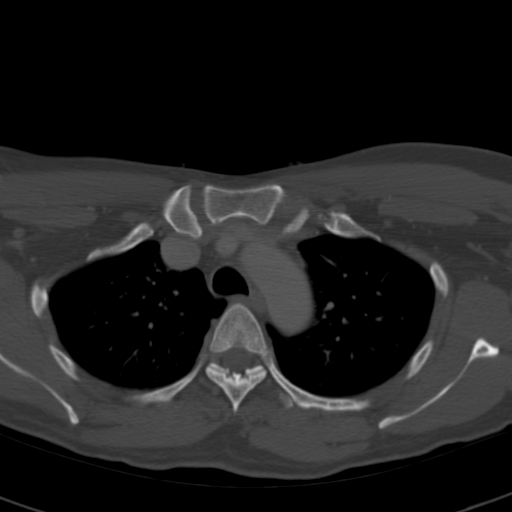
[im 55/137  bone]
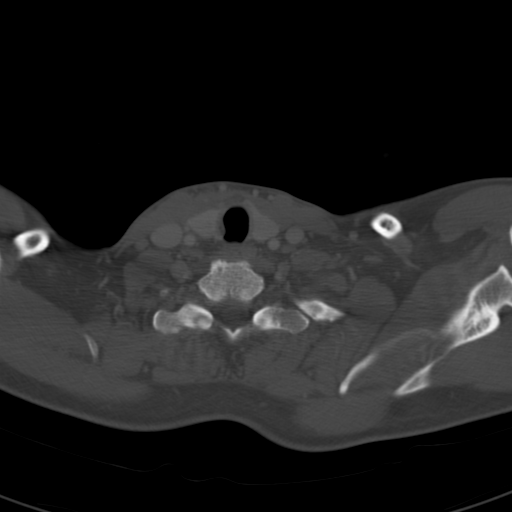
[im 82/137  bone]
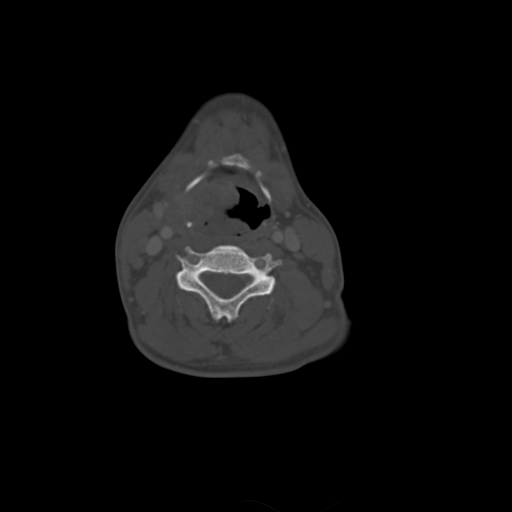
[im 109/137  bone]
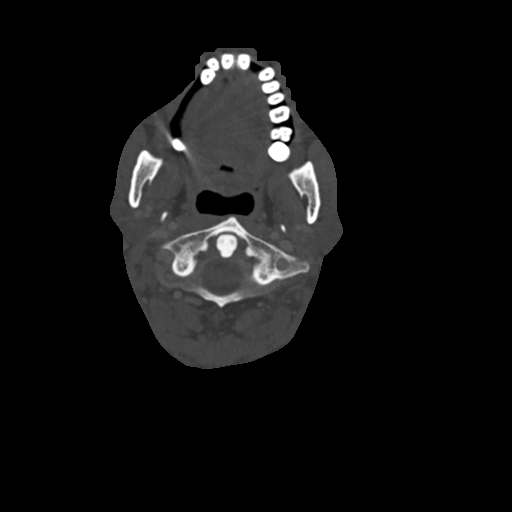

[Series 7: ax oropharynx · axial · 0.35mm/px · z∈[-290,-118]mm · 4 of 148 slices shown, 5 images]
[im 30/148  soft-tissue]
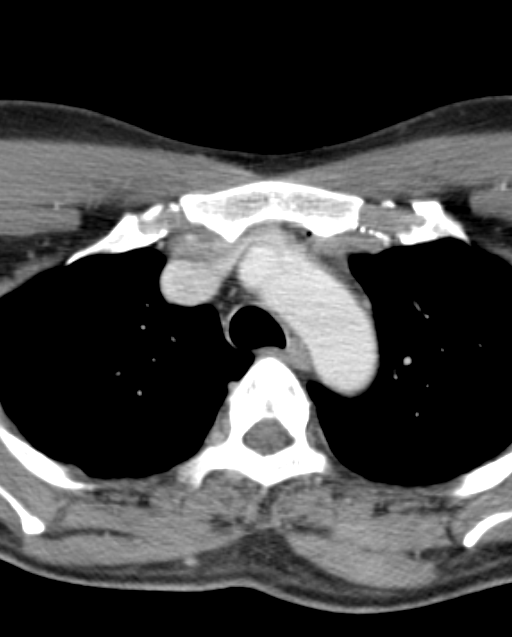
[im 30/148  bone]
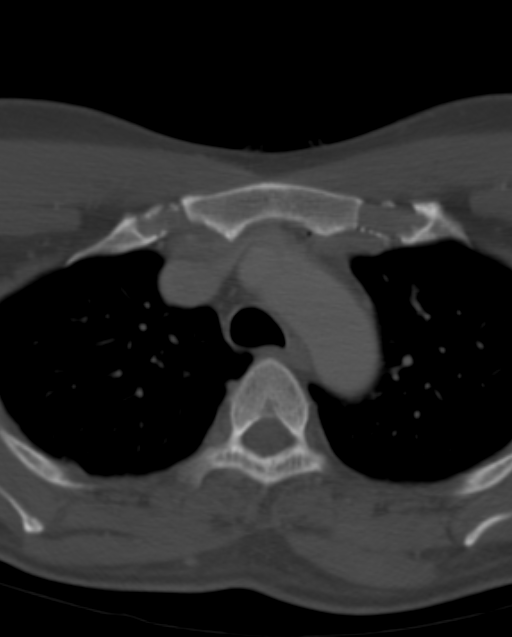
[im 59/148  bone]
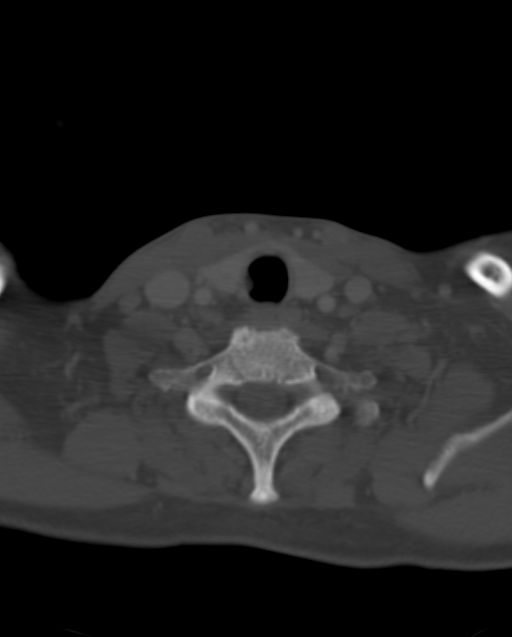
[im 89/148  bone]
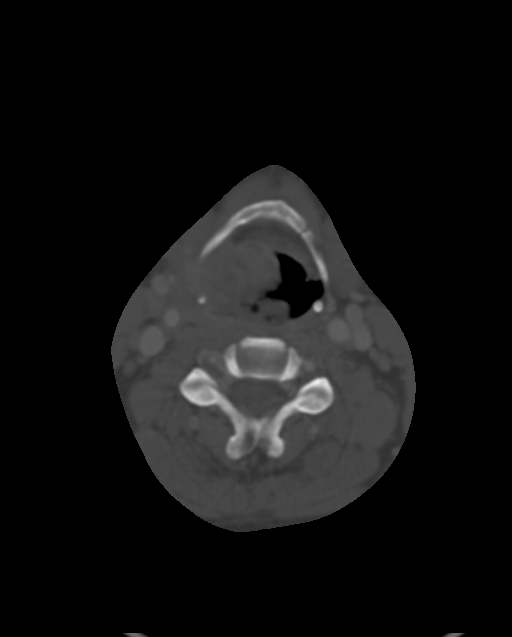
[im 118/148  bone]
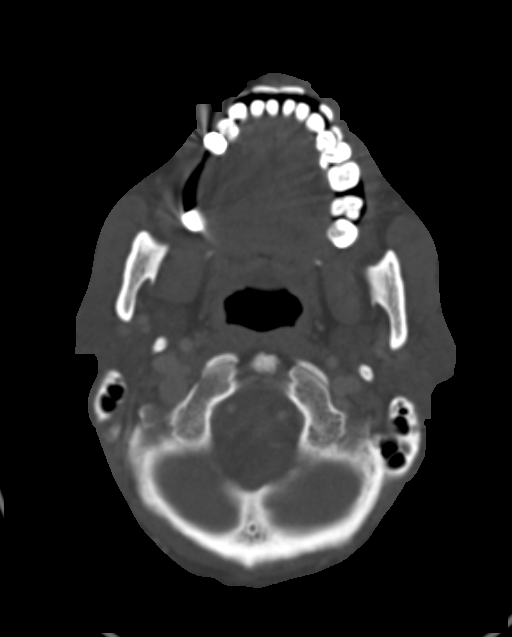

[8 of 14 positions shown; findings below may reference images not displayed]

FINDINGS: Pharynx and larynx: There is a heterogeneously enhancing mass
centered in the region of the right piriform sinus which measures
approximately 5.3 x 3.5 x 4.7 cm. There areas of internal necrosis.
The mass involves the right aryepiglottic fold, which is displaced
leftward. There is mild narrowing of the supraglottic larynx. The
airway remains patent. The mass extends posteriorly in the
oropharynx and inferiorly into the right lateral aspect of the
supraglottic larynx. The mass invades through the right thyrohyoid
membrane extending laterally in the neck with involvement of the
strap muscles. There are destructive changes involving the posterior
aspect of the thyroid cartilage on the right. There is also
asymmetric sclerosis of the right arytenoid cartilage and superior
aspect of the right cricoid cartilage, indeterminate.

Salivary glands: Parotid glands and left submandibular gland are
unremarkable. The right submandibular gland is mildly displaced
anteriorly by the above described mass.

Thyroid: Unremarkable. The abut described mass is in close proximity
to the superior pole of the right thyroid lobe but appears separate
from it.

Lymph nodes: Mildly enlarged lymph nodes in the right neck measure
up to 1.1 cm in short axis in level II, up to 8 mm in level III, up
to 6 mm in level IV, and 8 mm in level IB.

Vascular: Major vascular structures of the neck appear patent.

Limited intracranial: The visualized portion of the brain is
unremarkable.

Visualized orbits: Unremarkable.

Mastoids and visualized paranasal sinuses: Mild mucosal thickening
in the ethmoid and maxillary sinuses. Clear mastoid air cells.

Skeleton: Mild cervical spondylosis. No suspicious lytic or blastic
osseous lesion identified.

Upper chest: Minimal pleural thickening in the lung apices.
Subcentimeter ground-glass opacities partially visualized in the
right upper lobe.
IMPRESSION: 1. 5.3 cm mass centered in the region of the right piriform sinus
with invasion laterally through the thyrohyoid membrane and with
thyroid cartilage destruction as above, consistent with malignancy.
2. Mildly enlarged right level IB-IV lymph nodes, concerning for
nodal metastatic disease.
